# Patient Record
Sex: Male | Born: 1940 | Race: White | Hispanic: No | Marital: Married | Smoking: Never smoker
Health system: Southern US, Community
[De-identification: ages and names within clinical notes are randomized; demographics above are authoritative.]

## PROBLEM LIST (undated history)

## (undated) DIAGNOSIS — I10 Essential (primary) hypertension: Secondary | ICD-10-CM

## (undated) DIAGNOSIS — E119 Type 2 diabetes mellitus without complications: Secondary | ICD-10-CM

## (undated) DIAGNOSIS — I4891 Unspecified atrial fibrillation: Secondary | ICD-10-CM

## (undated) HISTORY — PX: KNEE SURGERY: SHX244

---

## 2020-01-28 ENCOUNTER — Ambulatory Visit: Payer: Self-pay | Admitting: Orthopaedic Surgery

## 2020-04-18 ENCOUNTER — Other Ambulatory Visit: Payer: Self-pay

## 2020-04-18 ENCOUNTER — Emergency Department (HOSPITAL_COMMUNITY)
Admission: EM | Admit: 2020-04-18 | Discharge: 2020-04-18 | Disposition: A | Discharge status: Home / Self Care | Payer: Medicare HMO | Attending: Emergency Medicine | Admitting: Emergency Medicine

## 2020-04-18 ENCOUNTER — Encounter (HOSPITAL_COMMUNITY): Payer: Self-pay | Admitting: Emergency Medicine

## 2020-04-18 DIAGNOSIS — Z5321 Procedure and treatment not carried out due to patient leaving prior to being seen by health care provider: Secondary | ICD-10-CM | POA: Insufficient documentation

## 2020-04-18 DIAGNOSIS — R531 Weakness: Secondary | ICD-10-CM | POA: Insufficient documentation

## 2020-04-18 LAB — URINALYSIS, ROUTINE W REFLEX MICROSCOPIC
Bilirubin Urine: NEGATIVE
Glucose, UA: NEGATIVE mg/dL
Hgb urine dipstick: NEGATIVE
Ketones, ur: NEGATIVE mg/dL
Nitrite: NEGATIVE
Protein, ur: NEGATIVE mg/dL
Specific Gravity, Urine: 1.019 (ref 1.005–1.030)
pH: 5 (ref 5.0–8.0)

## 2020-04-18 LAB — BASIC METABOLIC PANEL
Anion gap: 12 (ref 5–15)
BUN: 16 mg/dL (ref 8–23)
CO2: 25 mmol/L (ref 22–32)
Calcium: 9.1 mg/dL (ref 8.9–10.3)
Chloride: 100 mmol/L (ref 98–111)
Creatinine, Ser: 1.06 mg/dL (ref 0.61–1.24)
GFR calc Af Amer: >60 mL/min (ref >60)
GFR calc non Af Amer: >60 mL/min (ref >60)
Glucose, Bld: 121 mg/dL — ABNORMAL HIGH (ref 70–99)
Potassium: 5 mmol/L (ref 3.5–5.1)
Sodium: 137 mmol/L (ref 135–145)

## 2020-04-18 LAB — CBC
HCT: 44.1 % (ref 39.0–52.0)
Hemoglobin: 13.8 g/dL (ref 13.0–17.0)
MCH: 30.3 pg (ref 26.0–34.0)
MCHC: 31.3 g/dL (ref 30.0–36.0)
MCV: 96.7 fL (ref 80.0–100.0)
Platelets: 172 10*3/uL (ref 150–400)
RBC: 4.56 MIL/uL (ref 4.22–5.81)
RDW: 15 % (ref 11.5–15.5)
WBC: 7.8 10*3/uL (ref 4.0–10.5)
nRBC: 0 % (ref 0.0–0.2)

## 2020-04-18 MED ORDER — SODIUM CHLORIDE 0.9% FLUSH: 3.0000 mL | Freq: Once | INTRAVENOUS | Status: DC

## 2020-04-18 NOTE — ED Notes (Signed)
Pt and family member were upset about wait time and stated that they were not going to continue to wait. This NT encouraged the pt to stay and be seen by one of our ED providers but pt proceeded to leave.

## 2020-04-18 NOTE — ED Triage Notes (Signed)
Patient arrives to ED with EMS with complaints of worsening of his generalized weakness for the last couple of days. Patient states his blood pressure at home has been below 100 systolic and hes having a harder time walking.

## 2020-04-21 ENCOUNTER — Other Ambulatory Visit: Payer: Self-pay

## 2020-04-21 ENCOUNTER — Emergency Department (HOSPITAL_BASED_OUTPATIENT_CLINIC_OR_DEPARTMENT_OTHER)
Admission: EM | Admit: 2020-04-21 | Discharge: 2020-04-21 | Disposition: A | Discharge status: Home / Self Care | Payer: Medicare HMO | Attending: Emergency Medicine | Admitting: Emergency Medicine

## 2020-04-21 ENCOUNTER — Emergency Department (HOSPITAL_BASED_OUTPATIENT_CLINIC_OR_DEPARTMENT_OTHER): Payer: Medicare HMO

## 2020-04-21 ENCOUNTER — Encounter (HOSPITAL_BASED_OUTPATIENT_CLINIC_OR_DEPARTMENT_OTHER): Payer: Self-pay | Admitting: Emergency Medicine

## 2020-04-21 DIAGNOSIS — E119 Type 2 diabetes mellitus without complications: Secondary | ICD-10-CM | POA: Diagnosis not present

## 2020-04-21 DIAGNOSIS — Z79899 Other long term (current) drug therapy: Secondary | ICD-10-CM | POA: Diagnosis not present

## 2020-04-21 DIAGNOSIS — R531 Weakness: Secondary | ICD-10-CM | POA: Diagnosis present

## 2020-04-21 DIAGNOSIS — I10 Essential (primary) hypertension: Secondary | ICD-10-CM | POA: Insufficient documentation

## 2020-04-21 DIAGNOSIS — Z794 Long term (current) use of insulin: Secondary | ICD-10-CM | POA: Diagnosis not present

## 2020-04-21 DIAGNOSIS — Z7901 Long term (current) use of anticoagulants: Secondary | ICD-10-CM | POA: Insufficient documentation

## 2020-04-21 DIAGNOSIS — I4891 Unspecified atrial fibrillation: Secondary | ICD-10-CM | POA: Diagnosis not present

## 2020-04-21 DIAGNOSIS — M6281 Muscle weakness (generalized): Secondary | ICD-10-CM | POA: Diagnosis not present

## 2020-04-21 HISTORY — DX: Type 2 diabetes mellitus without complications: E11.9

## 2020-04-21 HISTORY — DX: Unspecified atrial fibrillation: I48.91

## 2020-04-21 HISTORY — DX: Essential (primary) hypertension: I10

## 2020-04-21 LAB — BASIC METABOLIC PANEL
Anion gap: 10 (ref 5–15)
BUN: 13 mg/dL (ref 8–23)
CO2: 22 mmol/L (ref 22–32)
Calcium: 8.6 mg/dL — ABNORMAL LOW (ref 8.9–10.3)
Chloride: 106 mmol/L (ref 98–111)
Creatinine, Ser: 0.97 mg/dL (ref 0.61–1.24)
GFR calc Af Amer: >60 mL/min (ref >60)
GFR calc non Af Amer: >60 mL/min (ref >60)
Glucose, Bld: 176 mg/dL — ABNORMAL HIGH (ref 70–99)
Potassium: 4 mmol/L (ref 3.5–5.1)
Sodium: 138 mmol/L (ref 135–145)

## 2020-04-21 LAB — CBC
HCT: 40.9 % (ref 39.0–52.0)
Hemoglobin: 13.2 g/dL (ref 13.0–17.0)
MCH: 30.6 pg (ref 26.0–34.0)
MCHC: 32.3 g/dL (ref 30.0–36.0)
MCV: 94.9 fL (ref 80.0–100.0)
Platelets: 153 10*3/uL (ref 150–400)
RBC: 4.31 MIL/uL (ref 4.22–5.81)
RDW: 14.9 % (ref 11.5–15.5)
WBC: 7.4 10*3/uL (ref 4.0–10.5)
nRBC: 0 % (ref 0.0–0.2)

## 2020-04-21 LAB — URINALYSIS, ROUTINE W REFLEX MICROSCOPIC
Bilirubin Urine: NEGATIVE
Glucose, UA: NEGATIVE mg/dL
Hgb urine dipstick: NEGATIVE
Ketones, ur: NEGATIVE mg/dL
Leukocytes,Ua: NEGATIVE
Nitrite: NEGATIVE
Protein, ur: NEGATIVE mg/dL
Specific Gravity, Urine: 1.01 (ref 1.005–1.030)
pH: 6 (ref 5.0–8.0)

## 2020-04-21 LAB — CBG MONITORING, ED: Glucose-Capillary: 153 mg/dL — ABNORMAL HIGH (ref 70–99)

## 2020-04-21 LAB — PROTIME-INR
INR: 2.1 — ABNORMAL HIGH (ref 0.8–1.2)
Prothrombin Time: 22.7 seconds — ABNORMAL HIGH (ref 11.4–15.2)

## 2020-04-21 LAB — D-DIMER, QUANTITATIVE: D-Dimer, Quant: 1.22 ug/mL-FEU — ABNORMAL HIGH (ref 0.00–0.50)

## 2020-04-21 LAB — TROPONIN I (HIGH SENSITIVITY): Troponin I (High Sensitivity): 8 ng/L (ref <18)

## 2020-04-21 MED ORDER — IOHEXOL 350 MG/ML SOLN
100.0000 mL | Freq: Once | INTRAVENOUS | Status: AC | PRN
  Administered 2020-04-21: 100 mL via INTRAVENOUS

## 2020-04-21 MED ORDER — SODIUM CHLORIDE 0.9 % IV BOLUS
1000.0000 mL | Freq: Once | INTRAVENOUS | Status: AC
  Administered 2020-04-21: 1000 mL via INTRAVENOUS

## 2020-04-21 NOTE — ED Notes (Signed)
Pt unable to void at this time. Aware to make inform staff when ready to give specimen .

## 2020-04-21 NOTE — ED Notes (Signed)
Pt. Ambulated in room with walker, was able to walk and move around room, no dizziness, no sob, took a few steps outside the room in hallway and "felt like a new man".

## 2020-04-21 NOTE — ED Triage Notes (Signed)
Brought in c/o generalized weakness. Wife states she took his BP this morning and systolic was in the 70's. He has had multiple issues since a R knee replacement in April and is currently having PT at home.

## 2020-04-21 NOTE — ED Notes (Signed)
Patient transported to X-ray 

## 2020-04-21 NOTE — ED Provider Notes (Signed)
[facility] EMERGENCY DEPARTMENT Provider Note   CSN: [id number] Arrival date & time: 04/21/20  4782     History Chief Complaint  Patient presents with  . Weakness    Bobby Petty is a 79 y.o. male.  HPI 79 year old male presents with weakness. History is primarily driven by his wife. Patient has had prolonged course with knee replacement at the end of April with a several week stay in the [facility] hospital for weakness and orthostatic hypotension. Was placed on midodrine at that time. Went to a SNF and then got out a few days ago. Has a lot of difficulty getting up and walking. Seems to get hypotensive when he stands up. Went to an outside hospital 2 days ago and was taken off of metoprolol. Patient states he feels fine right now but has difficulty walking. The weakness feels like it is all over. No troubles urinating though he did have a Foley catheter for about 40 days. No fevers, cough, chest pain. He has felt short of breath over the last week or so when getting up to walk.   Past Medical History:  Diagnosis Date  . Atrial fibrillation (HCC)   . Diabetes mellitus without complication (HCC)   . Hypertension     There are no problems to display for this patient.   Past Surgical History:  Procedure Laterality Date  . KNEE SURGERY         No family history on file.  Social History   Tobacco Use  . Smoking status: Never Smoker  . Smokeless tobacco: Never Used  Substance Use Topics  . Alcohol use: Not on file  . Drug use: Not on file    Home Medications Prior to Admission medications   Medication Sig Start Date End Date Taking? Authorizing Provider  atorvastatin (LIPITOR) 20 MG tablet Take 20 mg by mouth daily.   Yes [provider]  flecainide (TAMBOCOR) 50 MG tablet Take 50 mg by mouth 2 (two) times daily.   Yes [provider]  glipiZIDE (GLUCOTROL) 10 MG tablet Take 10 mg by mouth daily before breakfast.   Yes [provider]  insulin lispro protamine-lispro (HUMALOG 50/50 MIX) (50-50) 100 UNIT/ML SUSP injection Inject into the skin 2 (two) times daily before a meal.   Yes [provider]  metoprolol tartrate (LOPRESSOR) 25 MG tablet Take 25 mg by mouth 2 (two) times daily.   Yes [provider]  midodrine (PROAMATINE) 5 MG tablet Take 5 mg by mouth 3 (three) times daily with meals.   Yes [provider]  tamsulosin (FLOMAX) 0.4 MG CAPS capsule Take 0.4 mg by mouth.   Yes [provider]  warfarin (COUMADIN) 7.5 MG tablet Take 7.5 mg by mouth daily.   Yes [provider]    Allergies    Gabapentin  Review of Systems   Review of Systems  Constitutional: Positive for fatigue. Negative for fever.  Respiratory: Positive for shortness of breath. Negative for cough.   Cardiovascular: Negative for chest pain and leg swelling.  Gastrointestinal: Negative for abdominal pain, diarrhea and vomiting.  Genitourinary: Negative for difficulty urinating and dysuria.  Neurological: Positive for weakness. Negative for headaches.  All other systems reviewed and are negative.   Physical Exam Updated Vital Signs BP 138/79   Pulse 85   Temp 97.9 F (36.6 C) (Oral)   Resp 19   Ht 5\' 11"  (1.803 m)   Wt 63.5 kg   SpO2 99%  BMI 19.52 kg/m   Physical Exam Vitals and nursing note reviewed.  Constitutional:      Appearance: He is well-developed.  HENT:     Head: Normocephalic and atraumatic.     Right Ear: External ear normal.     Left Ear: External ear normal.     Nose: Nose normal.  Eyes:     General:        Right eye: No discharge.        Left eye: No discharge.     Extraocular Movements: Extraocular movements intact.     Pupils: Pupils are equal, round, and reactive to light.  Cardiovascular:     Rate and Rhythm: Normal rate and regular rhythm.     Heart sounds: Normal heart sounds.  Pulmonary:     Effort: Pulmonary effort is normal.     Breath  sounds: Normal breath sounds.  Abdominal:     General: There is no distension.     Palpations: Abdomen is soft.     Tenderness: There is no abdominal tenderness.  Musculoskeletal:     Cervical back: Neck supple.  Skin:    General: Skin is warm and dry.  Neurological:     Mental Status: He is alert.     Comments: CN 3-12 grossly intact. 5/5 strength in all 4 extremities. Grossly normal sensation. Normal finger to nose.   Psychiatric:        Mood and Affect: Mood is not anxious.     ED Results / Procedures / Treatments   Labs (all labs ordered are listed, but only abnormal results are displayed) Labs Reviewed  BASIC METABOLIC PANEL - Abnormal; Notable for the following components:      Result Value   Glucose, Bld 176 (*)    Calcium 8.6 (*)    All other components within normal limits  PROTIME-INR - Abnormal; Notable for the following components:   Prothrombin Time 22.7 (*)    INR 2.1 (*)    All other components within normal limits  D-DIMER, QUANTITATIVE (NOT AT [facility]) - Abnormal; Notable for the following components:   D-Dimer, Quant 1.22 (*)    All other components within normal limits  CBG MONITORING, ED - Abnormal; Notable for the following components:   Glucose-Capillary 153 (*)    All other components within normal limits  CBC  URINALYSIS, ROUTINE W REFLEX MICROSCOPIC  TROPONIN I (HIGH SENSITIVITY)    EKG EKG Interpretation  Date/Time:  Thursday April 21 2020 09:48:25 EDT Ventricular Rate:  87 PR Interval:    QRS Duration: 97 QT Interval:  363 QTC Calculation: 437 R Axis:   -24 Text Interpretation: Sinus rhythm Consider left atrial enlargement Borderline left axis deviation Low voltage, extremity leads no significant change since April 18 2020 Confirmed by Sherwood Gambler [phone]) on 04/21/2020 10:03:24 AM   Radiology DG Chest 2 View  Result Date: 04/21/2020 CLINICAL DATA:  Shortness of breath. EXAM: CHEST - 2 VIEW COMPARISON:  Single-view of the chest  03/16/2020. PA and lateral chest 02/24/2020. FINDINGS: Lungs clear. Heart size normal. Atherosclerosis noted. No pneumothorax or pleural fluid. No acute or focal bony abnormality. IMPRESSION: No acute disease. Aortic Atherosclerosis (ICD10-I70.0). Electronically Signed   By: Inge Rise M.D.   On: 04/21/2020 11:27   CT Angio Chest PE W and/or Wo Contrast  Result Date: 04/21/2020 CLINICAL DATA:  Shortness of breath, PE suspected EXAM: CT ANGIOGRAPHY CHEST WITH CONTRAST TECHNIQUE: Multidetector CT imaging of the chest was performed using the standard  protocol during bolus administration of intravenous contrast. Multiplanar CT image reconstructions and MIPs were obtained to evaluate the vascular anatomy. CONTRAST:  OMNIPAQUE IOHEXOL 350 MG/ML SOLN COMPARISON:  CT chest abdomen pelvis, 08/10/2018 FINDINGS: Cardiovascular: Satisfactory opacification of the pulmonary arteries to the segmental level. No evidence of pulmonary embolism. Normal heart size. Three-vessel coronary artery calcifications. No pericardial effusion. Aortic atherosclerosis. Mediastinum/Nodes: No enlarged mediastinal, hilar, or axillary lymph nodes. Thyroid gland, trachea, and esophagus demonstrate no significant findings. Lungs/Pleura: Dependent bibasilar scarring and/or partial atelectasis. Incidental stable, small, benign 3 mm nodule of the right upper lobe (series 5, image 51). No pleural effusion or pneumothorax. Upper Abdomen: No acute abnormality. Musculoskeletal: No chest wall abnormality. No acute or significant osseous findings. Review of the MIP images confirms the above findings. IMPRESSION: 1. Negative examination for pulmonary embolism. 2. Dependent bibasilar scarring and/or partial atelectasis. 3. Coronary artery disease.  Aortic Atherosclerosis (ICD10-I70.0). Electronically Signed   By: Lauralyn Primes M.D.   On: 04/21/2020 12:33    Procedures Procedures (including critical care time)  Medications Ordered in  ED Medications  sodium chloride 0.9 % bolus 1,000 mL (0 mLs Intravenous Stopped 04/21/20 1300)  iohexol (OMNIPAQUE) 350 MG/ML injection 100 mL (100 mLs Intravenous Contrast Given 04/21/20 1152)    ED Course  I have reviewed the triage vital signs and the nursing notes.  Pertinent labs & imaging results that were available during my care of the patient were reviewed by me and considered in my medical decision making (see chart for details).    MDM Rules/Calculators/A&P                          Patient and wife are describing weakness that has been ongoing for quite some time.  He is not hypotensive here.  Initially they did not test his orthostatics with standing because of the weakness he is been having at home but when encouraged to walk with a walker, he was able to ambulate with no difficulty or dizziness.  He does seem to be having low blood pressures when he stands at home.  He is on midodrine and was taken off the metoprolol.  He was worked up for the shortness of breath which is unrevealing with negative troponin, no acute ischemic changes on ECG, and negative CTA.  I have reviewed these labs and images and ECG.  At this point, I think he needs further follow-up and outpatient care and likely further rehab by his PCP but there is no emergent reason he needs admission.  Infection seems unlikely.  Discharged home with return precautions. Final Clinical Impression(s) / ED Diagnoses Final diagnoses:  Generalized weakness    Rx / DC Orders ED Discharge Orders    None       Pricilla Loveless, MD 04/21/20 1433

## 2020-05-27 ENCOUNTER — Telehealth: Payer: Self-pay | Admitting: Internal Medicine

## 2020-05-27 NOTE — Telephone Encounter (Signed)
Spoke with patient's wife, Talbert Forest, regarding Palliative services and all questions were answered and she was in agreement with this.  I have scheduled an In-person Consult for 06/09/20 @ 8:30 AM.

## 2020-06-08 ENCOUNTER — Telehealth: Payer: Self-pay | Admitting: Internal Medicine

## 2020-06-08 NOTE — Telephone Encounter (Signed)
Rec'd call from wife, Talbert Forest, and she is needing to cancel the Palliative Consult scheduled for 7/29 @ 8:30 AM.  I told her that I would call her back after I look at the NP's schedule to see when I could reschedule the appointment and that I would call her back, she was in agreement with this.  She stated that the patient is doing much better than he was before.

## 2020-06-08 NOTE — Telephone Encounter (Signed)
Spoke with wife and have rescheduled the Initial Palliative Consult to 06/29/20 @ 8:30 AM.  I informed wife if anything changed in patient's condition and he needed to be seen sooner to call us and we would get someone out to see him sooner.  She stated that he is still receiving home health services and has a Charity fundraiser and PT coming out to see him.  She agreed to call us if patient needed to be seen sooner.

## 2020-06-09 ENCOUNTER — Other Ambulatory Visit: Payer: Medicare HMO | Admitting: Internal Medicine

## 2020-06-29 ENCOUNTER — Other Ambulatory Visit: Payer: Self-pay

## 2020-06-29 ENCOUNTER — Other Ambulatory Visit: Payer: Medicare HMO | Admitting: Internal Medicine

## 2020-07-06 ENCOUNTER — Telehealth: Payer: Self-pay | Admitting: Internal Medicine

## 2020-07-06 NOTE — Telephone Encounter (Signed)
Rec'd voicemail from patient's wife, Talbert Forest, requesting a return call.  Spoke with wife and she would like to cancel Palliative services due to the patient has done a complete turn around and has improved greatly and she doesn't feel that he needs our services at this time.  Told her I would cancel Palliative services and notify MD office and she was in agreement with this.

## 2020-07-11 ENCOUNTER — Other Ambulatory Visit: Payer: Medicare HMO | Admitting: Internal Medicine

## 2020-10-15 ENCOUNTER — Other Ambulatory Visit: Payer: Self-pay

## 2020-10-15 ENCOUNTER — Observation Stay (HOSPITAL_COMMUNITY)
Admission: EM | Admit: 2020-10-15 | Discharge: 2020-10-18 | Disposition: A | Discharge status: Home / Self Care | Payer: Medicare HMO | Attending: Internal Medicine | Admitting: Internal Medicine

## 2020-10-15 ENCOUNTER — Encounter (HOSPITAL_COMMUNITY): Payer: Self-pay | Admitting: Emergency Medicine

## 2020-10-15 DIAGNOSIS — Z794 Long term (current) use of insulin: Secondary | ICD-10-CM | POA: Insufficient documentation

## 2020-10-15 DIAGNOSIS — R253 Fasciculation: Secondary | ICD-10-CM | POA: Diagnosis present

## 2020-10-15 DIAGNOSIS — G253 Myoclonus: Secondary | ICD-10-CM | POA: Diagnosis not present

## 2020-10-15 DIAGNOSIS — Z79899 Other long term (current) drug therapy: Secondary | ICD-10-CM | POA: Insufficient documentation

## 2020-10-15 DIAGNOSIS — I4891 Unspecified atrial fibrillation: Secondary | ICD-10-CM | POA: Insufficient documentation

## 2020-10-15 DIAGNOSIS — I10 Essential (primary) hypertension: Secondary | ICD-10-CM | POA: Insufficient documentation

## 2020-10-15 DIAGNOSIS — E119 Type 2 diabetes mellitus without complications: Secondary | ICD-10-CM | POA: Diagnosis not present

## 2020-10-15 DIAGNOSIS — Z20822 Contact with and (suspected) exposure to covid-19: Secondary | ICD-10-CM | POA: Insufficient documentation

## 2020-10-15 DIAGNOSIS — I480 Paroxysmal atrial fibrillation: Secondary | ICD-10-CM

## 2020-10-15 LAB — CBC WITH DIFFERENTIAL/PLATELET
Abs Immature Granulocytes: 0.02 10*3/uL (ref 0.00–0.07)
Basophils Absolute: 0.1 10*3/uL (ref 0.0–0.1)
Basophils Relative: 1 %
Eosinophils Absolute: 0.1 10*3/uL (ref 0.0–0.5)
Eosinophils Relative: 2 %
HCT: 44.9 % (ref 39.0–52.0)
Hemoglobin: 14.7 g/dL (ref 13.0–17.0)
Immature Granulocytes: 0 %
Lymphocytes Relative: 25 %
Lymphs Abs: 1.7 10*3/uL (ref 0.7–4.0)
MCH: 31.6 pg (ref 26.0–34.0)
MCHC: 32.7 g/dL (ref 30.0–36.0)
MCV: 96.6 fL (ref 80.0–100.0)
Monocytes Absolute: 0.5 10*3/uL (ref 0.1–1.0)
Monocytes Relative: 8 %
Neutro Abs: 4.3 10*3/uL (ref 1.7–7.7)
Neutrophils Relative %: 64 %
Platelets: 150 10*3/uL (ref 150–400)
RBC: 4.65 MIL/uL (ref 4.22–5.81)
RDW: 13.3 % (ref 11.5–15.5)
WBC: 6.7 10*3/uL (ref 4.0–10.5)
nRBC: 0 % (ref 0.0–0.2)

## 2020-10-15 LAB — COMPREHENSIVE METABOLIC PANEL
ALT: 52 U/L — ABNORMAL HIGH (ref 0–44)
AST: 47 U/L — ABNORMAL HIGH (ref 15–41)
Albumin: 3.8 g/dL (ref 3.5–5.0)
Alkaline Phosphatase: 64 U/L (ref 38–126)
Anion gap: 13 (ref 5–15)
BUN: 15 mg/dL (ref 8–23)
CO2: 21 mmol/L — ABNORMAL LOW (ref 22–32)
Calcium: 8.7 mg/dL — ABNORMAL LOW (ref 8.9–10.3)
Chloride: 103 mmol/L (ref 98–111)
Creatinine, Ser: 0.83 mg/dL (ref 0.61–1.24)
GFR, Estimated: >60 mL/min (ref >60)
Glucose, Bld: 143 mg/dL — ABNORMAL HIGH (ref 70–99)
Potassium: 4.2 mmol/L (ref 3.5–5.1)
Sodium: 137 mmol/L (ref 135–145)
Total Bilirubin: 0.8 mg/dL (ref 0.3–1.2)
Total Protein: 6.8 g/dL (ref 6.5–8.1)

## 2020-10-15 NOTE — ED Triage Notes (Signed)
Patient arrived with EMS from home reports worsening generalized twitchings/tremors onset this morning , respirations unlabored / denies pain , alert and oriented.

## 2020-10-16 ENCOUNTER — Emergency Department (HOSPITAL_COMMUNITY): Payer: Medicare HMO

## 2020-10-16 ENCOUNTER — Encounter (HOSPITAL_COMMUNITY): Payer: Self-pay | Admitting: Radiology

## 2020-10-16 DIAGNOSIS — G253 Myoclonus: Secondary | ICD-10-CM | POA: Diagnosis present

## 2020-10-16 DIAGNOSIS — R251 Tremor, unspecified: Secondary | ICD-10-CM | POA: Diagnosis not present

## 2020-10-16 LAB — CBC WITH DIFFERENTIAL/PLATELET
Abs Immature Granulocytes: 0.03 10*3/uL (ref 0.00–0.07)
Basophils Absolute: 0 10*3/uL (ref 0.0–0.1)
Basophils Relative: 1 %
Eosinophils Absolute: 0 10*3/uL (ref 0.0–0.5)
Eosinophils Relative: 1 %
HCT: 42.7 % (ref 39.0–52.0)
Hemoglobin: 13.8 g/dL (ref 13.0–17.0)
Immature Granulocytes: 1 %
Lymphocytes Relative: 17 %
Lymphs Abs: 1 10*3/uL (ref 0.7–4.0)
MCH: 31.7 pg (ref 26.0–34.0)
MCHC: 32.3 g/dL (ref 30.0–36.0)
MCV: 98.2 fL (ref 80.0–100.0)
Monocytes Absolute: 0.6 10*3/uL (ref 0.1–1.0)
Monocytes Relative: 9 %
Neutro Abs: 4.4 10*3/uL (ref 1.7–7.7)
Neutrophils Relative %: 71 %
Platelets: 138 10*3/uL — ABNORMAL LOW (ref 150–400)
RBC: 4.35 MIL/uL (ref 4.22–5.81)
RDW: 13.6 % (ref 11.5–15.5)
WBC: 6.1 10*3/uL (ref 4.0–10.5)
nRBC: 0 % (ref 0.0–0.2)

## 2020-10-16 LAB — I-STAT CHEM 8, ED
BUN: 21 mg/dL (ref 8–23)
Calcium, Ion: 1.09 mmol/L — ABNORMAL LOW (ref 1.15–1.40)
Chloride: 104 mmol/L (ref 98–111)
Creatinine, Ser: 0.9 mg/dL (ref 0.61–1.24)
Glucose, Bld: 140 mg/dL — ABNORMAL HIGH (ref 70–99)
HCT: 44 % (ref 39.0–52.0)
Hemoglobin: 15 g/dL (ref 13.0–17.0)
Potassium: 4.3 mmol/L (ref 3.5–5.1)
Sodium: 139 mmol/L (ref 135–145)
TCO2: 23 mmol/L (ref 22–32)

## 2020-10-16 LAB — COMPREHENSIVE METABOLIC PANEL
ALT: 48 U/L — ABNORMAL HIGH (ref 0–44)
AST: 35 U/L (ref 15–41)
Albumin: 3.6 g/dL (ref 3.5–5.0)
Alkaline Phosphatase: 53 U/L (ref 38–126)
Anion gap: 12 (ref 5–15)
BUN: 19 mg/dL (ref 8–23)
CO2: 21 mmol/L — ABNORMAL LOW (ref 22–32)
Calcium: 8.4 mg/dL — ABNORMAL LOW (ref 8.9–10.3)
Chloride: 102 mmol/L (ref 98–111)
Creatinine, Ser: 0.87 mg/dL (ref 0.61–1.24)
GFR, Estimated: >60 mL/min (ref >60)
Glucose, Bld: 122 mg/dL — ABNORMAL HIGH (ref 70–99)
Potassium: 4 mmol/L (ref 3.5–5.1)
Sodium: 135 mmol/L (ref 135–145)
Total Bilirubin: 0.9 mg/dL (ref 0.3–1.2)
Total Protein: 6.4 g/dL — ABNORMAL LOW (ref 6.5–8.1)

## 2020-10-16 LAB — MAGNESIUM
Magnesium: 2 mg/dL (ref 1.7–2.4)
Magnesium: 2.1 mg/dL (ref 1.7–2.4)

## 2020-10-16 LAB — PHOSPHORUS: Phosphorus: 2.7 mg/dL (ref 2.5–4.6)

## 2020-10-16 LAB — PROTIME-INR
INR: 2.4 — ABNORMAL HIGH (ref 0.8–1.2)
INR: 2.4 — ABNORMAL HIGH (ref 0.8–1.2)
Prothrombin Time: 25 seconds — ABNORMAL HIGH (ref 11.4–15.2)
Prothrombin Time: 25.3 seconds — ABNORMAL HIGH (ref 11.4–15.2)

## 2020-10-16 LAB — CBG MONITORING, ED: Glucose-Capillary: 148 mg/dL — ABNORMAL HIGH (ref 70–99)

## 2020-10-16 LAB — RESP PANEL BY RT-PCR (FLU A&B, COVID) ARPGX2
Influenza A by PCR: NEGATIVE
Influenza B by PCR: NEGATIVE
SARS Coronavirus 2 by RT PCR: NEGATIVE

## 2020-10-16 LAB — TSH: TSH: 1.04 u[IU]/mL (ref 0.350–4.500)

## 2020-10-16 LAB — AMMONIA: Ammonia: 21 umol/L (ref 9–35)

## 2020-10-16 LAB — HEMOGLOBIN A1C
Hgb A1c MFr Bld: 5.7 % — ABNORMAL HIGH (ref 4.8–5.6)
Mean Plasma Glucose: 116.89 mg/dL

## 2020-10-16 MED ORDER — DIAZEPAM 5 MG/ML IJ SOLN
5.0000 mg | Freq: Once | INTRAMUSCULAR | Status: AC
  Administered 2020-10-16: 5 mg via INTRAVENOUS
  Filled 2020-10-16: qty 2

## 2020-10-16 MED ORDER — DIAZEPAM 5 MG/ML IJ SOLN
2.5000 mg | Freq: Once | INTRAMUSCULAR | Status: AC
  Administered 2020-10-16: 2.5 mg via INTRAVENOUS
  Filled 2020-10-16: qty 2

## 2020-10-16 MED ORDER — ACETAMINOPHEN 650 MG RE SUPP: 650.0000 mg | Freq: Four times a day (QID) | RECTAL | Status: DC | PRN

## 2020-10-16 MED ORDER — INSULIN ASPART 100 UNIT/ML ~~LOC~~ SOLN
0.0000 [IU] | Freq: Three times a day (TID) | SUBCUTANEOUS | Status: DC
  Administered 2020-10-17: 2 [IU] via SUBCUTANEOUS
  Administered 2020-10-17: 3 [IU] via SUBCUTANEOUS
  Administered 2020-10-17 – 2020-10-18 (×3): 2 [IU] via SUBCUTANEOUS

## 2020-10-16 MED ORDER — WARFARIN SODIUM 5 MG PO TABS
5.0000 mg | ORAL_TABLET | Freq: Once | ORAL | Status: AC
  Administered 2020-10-16: 5 mg via ORAL
  Filled 2020-10-16 (×2): qty 1

## 2020-10-16 MED ORDER — ATENOLOL 25 MG PO TABS
12.5000 mg | ORAL_TABLET | Freq: Every day | ORAL | Status: DC
  Administered 2020-10-17 – 2020-10-18 (×2): 12.5 mg via ORAL
  Filled 2020-10-16 (×2): qty 1

## 2020-10-16 MED ORDER — ALPRAZOLAM 0.5 MG PO TABS: 0.5000 mg | ORAL_TABLET | Freq: Every evening | ORAL | Status: DC | PRN

## 2020-10-16 MED ORDER — ATORVASTATIN CALCIUM 10 MG PO TABS
20.0000 mg | ORAL_TABLET | Freq: Every day | ORAL | Status: DC
  Administered 2020-10-16 – 2020-10-18 (×3): 20 mg via ORAL
  Filled 2020-10-16 (×3): qty 2

## 2020-10-16 MED ORDER — ACETAMINOPHEN 325 MG PO TABS: 650.0000 mg | ORAL_TABLET | Freq: Four times a day (QID) | ORAL | Status: DC | PRN

## 2020-10-16 MED ORDER — WARFARIN - PHARMACIST DOSING INPATIENT: Freq: Every day | Status: DC

## 2020-10-16 NOTE — ED Notes (Signed)
Pt transported to MRI 

## 2020-10-16 NOTE — ED Notes (Signed)
Attempted to call report on patient. RN unable to take report at this time.  

## 2020-10-16 NOTE — Procedures (Signed)
Patient Name: Bobby Petty  MRN: [id number]  Epilepsy Attending: Charlsie Quest  Referring Physician/Provider: Dr Amadeo Garnet Cardama Date: 10/16/2020 Duration:   Patient history: 79yoM with presents to the emergency department with 24 hours of full body jerking.  Level of alertness: Awake, drowsy, sleep, comatose, lethargic  AEDs during EEG study: None  Technical aspects: This EEG study was done with scalp electrodes positioned according to the 10-20 International system of electrode placement. Electrical activity was acquired at a sampling rate of 500Hz  and reviewed with a high frequency filter of 70Hz  and a low frequency filter of 1Hz . EEG data were recorded continuously and digitally stored.   Description: The posterior dominant rhythm consists of 8 Hz activity of moderate voltage (25-35 uV) seen predominantly in posterior head regions, symmetric and reactive to eye opening and eye closing. Physiologic photic driving was not seen during photic stimulation.  Hyperventilation was not performed.     Multiple episodes of intermittent body twitching were noted. Concomitant eeg before, during and after the event didn't show any eeg change to suggest seizure.  IMPRESSION: This study is within normal limits. No seizures or epileptiform discharges were seen throughout the recording.  Multiple episodes of intermittent body twitching were noted without concomitant eeg change and were NOT epileptic.   Rory Montel 

## 2020-10-16 NOTE — Progress Notes (Signed)
ANTICOAGULATION CONSULT NOTE   Pharmacy Consult for Warfarin  Indication: atrial fibrillation  Allergies  Allergen Reactions  . Gabapentin     Patient Measurements: Height: 6' (182.9 cm) Weight: 85 kg (187 lb 6.3 oz) IBW/kg (Calculated) : 77.6 Heparin Dosing Weight:   Vital Signs: Temp: 97.7 F (36.5 C) (12/05 0200) Temp Source: Oral (12/05 0200) BP: 138/81 (12/05 1100) Pulse Rate: 77 (12/05 1100)  Labs: Recent Labs    10/15/20 2231 10/15/20 2231 10/16/20 0416 10/16/20 0525 10/16/20 1008  HGB 14.7   < >  --  15.0 13.8  HCT 44.9  --   --  44.0 42.7  PLT 150  --   --   --  138*  LABPROT  --   --  25.0*  --  25.3*  INR  --   --  2.4*  --  2.4*  CREATININE 0.83  --   --  0.90 0.87   < > = values in this interval not displayed.    Estimated Creatinine Clearance: 75.6 mL/min (by C-G formula based on SCr of 0.87 mg/dL).   Medical History: Past Medical History:  Diagnosis Date  . Atrial fibrillation (HCC)   . Diabetes mellitus without complication (HCC)   . Hypertension     Medications:  Scheduled:  . atorvastatin  20 mg Oral Daily  . warfarin  5 mg Oral ONCE-1600  . Warfarin - Pharmacist Dosing Inpatient   Does not apply q1600    Assessment: Patient is a 32 yom that is being admitted for twitching. At home the patient takes warfarin for Afib. Pharmacy has been asked to continue dosing his warfarin while inpatient.  Marland Kitchen PTA regimen: Warfarin 7.5mg  MWFSa and 5 mg TTSun  Goal of Therapy:  INR 2-3 Monitor platelets by anticoagulation protocol: Yes   Plan:  - Patient's INR is currently therapeutic at 2.4 - Will continue home regimen with Warfarin 5mg  PO x 1 dose tonight  - Monitor patient for s/s of bleeding and PT-inr daily  PharmD. BCPS  10/16/2020,11:24 AM

## 2020-10-16 NOTE — Hospital Course (Addendum)
Admitted 10/15/2020  Allergies: Gabapentin Pertinent Hx: Afib, DM, HTN, CAD, recent knee replacement (03/2020), BPH?, Hypotension (midodrine), unintentional weight loss  79 y.o. 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 EEG negative. MRI negative. Diazepam given in ED worked, no mas shaky shaky   *AFIB - warfarin and flecainide in sinus  Consults: none  Meds: Warfarin, SSI, atorvastatin, atenolol VTE ppx: Warfarin IVF: none Diet: HH    Home meds: atorvastatin, flecainide, glipizide 10 mg qAM, semiglutide 0.25 mg qweek, lispro BID, metoprolol, midodrine 5 mg TID, tamsulosin, warfarin, alprazolam 0.6 mg qHS  Normo-HTN, without tachycardia on RA No leukocytosis Mild elevated AST/ALT INR 2.4 Flecanide level pending Ammonia pending  Past elevated Ddimer  Past Medical History:  Diagnosis Date   Atrial fibrillation ([place])    Diabetes mellitus without complication ([place])    Hypertension     Afib followed by Dr. Adrian Prows Dr. Adrian Prows Dr. Novella Rob in [facility] April of last year, he underwent a right total knee replacement and has not felt well since BPH - flomax caused hypotension requiring midodrine Flomax and flecanide were discontinued on 05/2020 office visit Laveda Norman, MD [facility])  Cardiology appt on 11/22 - apparently back on flecanide 100 mg and anticoagulation with warfarin and SVT controlled with atenolol 12.5   acetaminophen (TYLENOL) 325 MG tablet, Take 325 mg by mouth every 6 (six) hours as needed. , Disp: , Rfl:   ALPRAZolam (XANAX) 0.5 MG tablet, Take 0.5 mg by mouth nightly as needed for Sleep., Disp: , Rfl:   ascorbic acid, vitamin C, (VITAMIN C) 500 MG tablet, Take 1 tablet (500 mg total) by mouth 2 times daily for 30 days. (Patient taking differently: Take 500 mg by mouth daily. ), Disp: , Rfl:   atenoloL (TENORMIN) 25 MG tablet, Take 12.5 mg by mouth daily., Disp: , Rfl:    atorvastatin (LIPITOR) 20 MG tablet, Take 1 tablet (20 mg total) by mouth daily., Disp: 90 tablet, Rfl: 3  flecainide (TAMBOCOR) 100 MG tablet, Take 1 tablet (100 mg total) by mouth 2 times daily., Disp: 180 tablet, Rfl: 1  glipiZIDE XL (GLUCOTROL XL) 10 MG 24 hr tablet, Take 1 tablet (10 mg total) by mouth daily., Disp: 90 tablet, Rfl: 3  lisinopriL (PRINIVIL,ZESTRIL) 5 MG tablet, 5 mg once daily depending on BP readings, Disp: 90 tablet, Rfl: 3  OMEGA-3/DHA/EPA/FISH OIL (OMEGA-3 FATTY ACIDS-FISH OIL) 300-1,000 mg capsule, Take 1 g by mouth 2 times daily. , Disp: , Rfl:   semaglutide (OZEMPIC) 0.25 mg or 0.5 mg(2 mg/1.5 mL) injection, Inject 0.4 mLs (0.5 mg total) into the skin once a week., Disp: 1.5 mL, Rfl: 5  vit C/E/Zn/coppr/lutein/zeaxan (PRESERVISION AREDS-2 ORAL), Take 1 tablet by mouth 2 times daily. , Disp: , Rfl:   warfarin (COUMADIN) 5 MG tablet *ANTICOAGULANT*, Take 1 and 1/2 tablets everyday except 1 tablet on Sundays, Tuesdays and Thursdays- as directed, Disp: 145 tablet, Rfl: 1  blood glucose control, high Solution, Use control solution to test meter, Disp: 1 each, Rfl: 1  blood glucose control, normal Solution, Use control solution to test meter., Disp: 1 each, Rfl: 0  blood-glucose meter Misc, Use meter kit to test blood sugars twice daily., Disp: 1 each, Rfl: 0  ONETOUCH VERIO TEST STRIPS Strp test strips, USE 1 STRIP TO CHECK GLUCOSE TWICE DAILY, Disp: 200 strip, Rfl: 1  No other recent changes in these medications.

## 2020-10-16 NOTE — ED Provider Notes (Signed)
[facility] EMERGENCY DEPARTMENT Provider Note  CSN: [id number] Arrival date & time: 10/15/20 2206  Chief Complaint(s) Twitchings/Tremors  HPI Bobby Petty is a 79 y.o. male with a past medical history listed below including atrial fibrillation on Coumadin who presents to the emergency department with 24 hours of full body jerking.  Patient reports that he has had a similar episode 20 years ago that were brief episodes lasting less than 30 minutes each every 6 months.  He reports that he has not had any of these episodes in the last 15 years.  He denies any focal deficits.  No headache.  No visual disturbance.  No associated chest pain or shortness of breath.  No nausea or vomiting.  No abdominal pain.  No recent fevers or infections.  Patient denies any alcohol or illicit drug use.  No chemical exposure.  No trauma.  Patient reports that he recently had an adjustment of his flecainide which was increased to 100 mg twice daily from 50 mg twice daily on November 17.  However he reports that this was lowered back in April/May during the hospitalization.  He reports that he has been on the 100 mg twice daily flecainide for approximately 5 years.  HPI  Past Medical History Past Medical History:  Diagnosis Date  . Atrial fibrillation (HCC)   . Diabetes mellitus without complication (HCC)   . Hypertension    There are no problems to display for this patient.  Home Medication(s) Prior to Admission medications   Medication Sig Start Date End Date Taking? Authorizing Provider  atorvastatin (LIPITOR) 20 MG tablet Take 20 mg by mouth daily.    [provider]  flecainide (TAMBOCOR) 50 MG tablet Take 50 mg by mouth 2 (two) times daily.    [provider]  glipiZIDE (GLUCOTROL) 10 MG tablet Take 10 mg by mouth daily before breakfast.    [provider]  insulin lispro protamine-lispro (HUMALOG 50/50 MIX) (50-50) 100 UNIT/ML SUSP injection Inject into  the skin 2 (two) times daily before a meal.    [provider]  metoprolol tartrate (LOPRESSOR) 25 MG tablet Take 25 mg by mouth 2 (two) times daily.    [provider]  midodrine (PROAMATINE) 5 MG tablet Take 5 mg by mouth 3 (three) times daily with meals.    [provider]  tamsulosin (FLOMAX) 0.4 MG CAPS capsule Take 0.4 mg by mouth.    [provider]  warfarin (COUMADIN) 7.5 MG tablet Take 7.5 mg by mouth daily.    [provider]                                                                                                                                    Past Surgical History Past Surgical History:  Procedure Laterality Date  . KNEE SURGERY     Family History No family history on file.  Social History Social  History   Tobacco Use  . Smoking status: Never Smoker  . Smokeless tobacco: Never Used  Substance Use Topics  . Alcohol use: Never  . Drug use: Never   Allergies Gabapentin  Review of Systems Review of Systems All other systems are reviewed and are negative for acute change except as noted in the HPI  Physical Exam Vital Signs  I have reviewed the triage vital signs BP 135/87   Pulse 80   Temp 97.7 F (36.5 C) (Oral)   Resp 19   Ht 6' (1.829 m)   Wt 85 kg   SpO2 95%   BMI 25.41 kg/m   Physical Exam Vitals reviewed.  Constitutional:      General: He is not in acute distress.    Appearance: He is well-developed. He is not diaphoretic.  HENT:     Head: Normocephalic and atraumatic.     Nose: Nose normal.  Eyes:     General: No scleral icterus.       Right eye: No discharge.        Left eye: No discharge.     Conjunctiva/sclera: Conjunctivae normal.     Pupils: Pupils are equal, round, and reactive to light.  Cardiovascular:     Rate and Rhythm: Normal rate and regular rhythm.     Heart sounds: No murmur heard.  No friction rub. No gallop.   Pulmonary:     Effort: Pulmonary effort is normal. No  respiratory distress.     Breath sounds: Normal breath sounds. No stridor. No rales.  Abdominal:     General: There is no distension.     Palpations: Abdomen is soft.     Tenderness: There is no abdominal tenderness.  Musculoskeletal:        General: No tenderness.     Cervical back: Normal range of motion and neck supple.  Skin:    General: Skin is warm and dry.     Findings: No erythema or rash.  Neurological:     Mental Status: He is alert and oriented to person, place, and time.     Comments: Mental Status:  Alert and oriented to person, place, and time.  Attention and concentration normal.  Speech clear.  Recent memory is intact  Cranial Nerves:  II Visual Fields: Intact to confrontation. Visual fields intact. III, IV, VI: Pupils equal and reactive to light and near. Full eye movement without nystagmus  V Facial Sensation: Normal. No weakness of masticatory muscles  VII: No facial weakness or asymmetry  VIII Auditory Acuity: Grossly normal  IX/X: The uvula is midline; the palate elevates symmetrically  XI: Normal sternocleidomastoid and trapezius strength  XII: The tongue is midline. No atrophy or fasciculations.   Motor System: Muscle Strength: 5/5 and symmetric in the upper and lower extremities. No pronation or drift.  Muscle Tone: Tone and muscle bulk are normal in the upper and lower extremities.  Having intermittent myoclonus of head, torso, and extremities. Reflexes: DTRs: 1+ and symmetrical in all four extremities. No Clonus Coordination: Intact finger-to-nose, heel-to-shin.   Sensation: Intact to light touch. Gait: deferred      ED Results and Treatments Labs (all labs ordered are listed, but only abnormal results are displayed) Labs Reviewed  COMPREHENSIVE METABOLIC PANEL - Abnormal; Notable for the following components:      Result Value   CO2 21 (*)    Glucose, Bld 143 (*)    Calcium 8.7 (*)    AST 47 (*)  ALT 52 (*)    All other components within  normal limits  PROTIME-INR - Abnormal; Notable for the following components:   Prothrombin Time 25.0 (*)    INR 2.4 (*)    All other components within normal limits  CBG MONITORING, ED - Abnormal; Notable for the following components:   Glucose-Capillary 148 (*)    All other components within normal limits  I-STAT CHEM 8, ED - Abnormal; Notable for the following components:   Glucose, Bld 140 (*)    Calcium, Ion 1.09 (*)    All other components within normal limits  RESP PANEL BY RT-PCR (FLU A&B, COVID) ARPGX2  CBC WITH DIFFERENTIAL/PLATELET  MAGNESIUM  AMMONIA  FLECAINIDE LEVEL                                                                                                                         EKG  EKG Interpretation  Date/Time:    Ventricular Rate:    PR Interval:    QRS Duration:   QT Interval:    QTC Calculation:   R Axis:     Text Interpretation:        Radiology CT Head Wo Contrast  Result Date: 10/16/2020 CLINICAL DATA:  Seizure EXAM: CT HEAD WITHOUT CONTRAST TECHNIQUE: Contiguous axial images were obtained from the base of the skull through the vertex without intravenous contrast. COMPARISON:  03/26/2020 FINDINGS: Brain: There is no mass, hemorrhage or extra-axial collection. There is generalized atrophy without lobar predilection. Hypodensity of the white matter is most commonly associated with chronic microvascular disease. Vascular: No abnormal hyperdensity of the major intracranial arteries or dural venous sinuses. No intracranial atherosclerosis. Skull: The visualized skull base, calvarium and extracranial soft tissues are normal. Sinuses/Orbits: No fluid levels or advanced mucosal thickening of the visualized paranasal sinuses. No mastoid or middle ear effusion. The orbits are normal. IMPRESSION: Generalized atrophy and chronic microvascular ischemia without acute intracranial abnormality. Electronically Signed   By: Deatra Robinson M.D.   On: 10/16/2020 05:52     Pertinent labs & imaging results that were available during my care of the patient were reviewed by me and considered in my medical decision making (see chart for details).  Medications Ordered in ED Medications  diazepam (VALIUM) injection 5 mg (has no administration in time range)  diazepam (VALIUM) injection 2.5 mg (2.5 mg Intravenous Given 10/16/20 0526)  Procedures Procedures  (including critical care time)  Medical Decision Making / ED Course I have reviewed the nursing notes for this encounter and the patient's prior records (if available in EHR or on provided paperwork).   Bobby Petty was evaluated in Emergency Department on 10/16/2020 for the symptoms described in the history of present illness. He was evaluated in the context of the global COVID-19 pandemic, which necessitated consideration that the patient might be at risk for infection with the SARS-CoV-2 virus that causes COVID-19. Institutional protocols and algorithms that pertain to the evaluation of patients at risk for COVID-19 are in a state of rapid change based on information released by regulatory bodies including the CDC and federal and state organizations. These policies and algorithms were followed during the patient's care in the ED.  Patient presents with myoclonus. No focal deficits on exam. Labs without significant electrolyte derangements.  He does have mild hypocalcemia but do not believe that these would be contributing to his symptoms. Other than flecainide patient has not taken any other medication that would cause myoclonus.  Symptoms significantly resolved with 2.5 mg of Valium. CT head negative. Symptoms returned approximately 30 minutes after Valium was given.  Case discussed with neurology, who does not believe these are consistent with seizures. She did recommend  obtaining an MRI, EEG, ammonia and flecainide levels.  Given the patient's difficulty ambulating and high risk of falls on blood thinners, will discuss admission with medicine.  If MRI reveals evidence of strokes or EEG reveals evidence of seizures, neurology can be formally consulted.      Final Clinical Impression(s) / ED Diagnoses Final diagnoses:  Myoclonus      This chart was dictated using voice recognition software.  Despite best efforts to proofread,  errors can occur which can change the documentation meaning.   Nira Conn, MD 10/16/20 [phone]

## 2020-10-16 NOTE — H&P (Addendum)
Date: 10/16/2020               [name] MRN: [id number]  DOB: [birth year] Age / Sex: 79 y.o., male   PCP: Redmond School, NP         Medical Service: Internal Medicine Teaching Service         Attending Physician: Dr. Reymundo Poll, MD    First Contact: Quincy Simmonds, MD Pager: [phone]  Second Contact: Drema Halon Pager: [facility] [phone])       After Hours (After 5p/  First Contact Pager: [phone]  weekends / holidays): Second Contact Pager: [phone]   Chief Complaint: Twitching  History of Present Illness:  [name] is a 79 year old male with a past medical history of paroxsymal atrial fibrillation on flecainide and warfarin, BPH, diabetes, hypertension, hyperlipidemia who presents with acute onset diffuse jerking that started early Saturday morning (12/04) that woke him up from sleep. No precipitating factors or preceding symptoms prior to when the jerking started. The jerking is constant got progressively worse throughout the day with difficulty walking or standing due to the jerking. He normally ambulates with a walker since his knee replacement in 02/2020. He has similar jerking episodes lasting several days at a time around 2008 and went to [facility] and neurologist (Dr. Quentin Mulling) without discovering clear etiology and was never placed on medications for this. The jerking resolved spontaneously without treatment and has not had recurrence in almost 6-7 years. He denies fever, chills, chest pain, palpitation, shortness of breath, dysuria, abdominal pain, constipation, LOC, loss of bowel or bladder function, confusion, weakness, dizziness, sensory change or headache. He denies any recent illness, new stressors, or injuries. Notes brother died several months ago, and he had a difficult hospitalization after knee surgery in April of this year.   Patient notes only recent medication is flecainide. He was hospitalized for knee replacement in April of 2021 and  subsequently developed urinary retention and placed on Flomax, but subsequently developed hypotension and dizziness requiring midodrine. At that time his flecainide dosage was decreased and was placed on metoprolol. At most recent visit with cardiology placed back on prior dose of flecainide of 100 mg twice daily and atenolol 12.5 daily. He take Alprazolam about twice a week for difficulty sleeping, but has not taken any in the past 3-4 days. He has also taken his wife's premipexole for restless legs, but states he generally does not have symptoms of restless less and has not taken any for months. He denies any supplement use.  In the ED patient was found to have myoclonus without focal neurological deficits. Labs without electrolyte derangement or leucocytosis. Treated with 2.5 mg valium prior to head CT with improvement of myoclonus but return and given 5 additional mgs of Valium without recurrence of myoclonus.   ED Course:   Lab Orders     Resp Panel by RT-PCR (Flu A&B, Covid) Nasopharyngeal Swab     CBC with Differential     Comprehensive metabolic panel     Magnesium     Protime-INR     Ammonia     Flecainide level     Comprehensive metabolic panel     Magnesium     Phosphorus     CBC WITH DIFFERENTIAL     Protime-INR     TSH     Hemoglobin A1c     Basic metabolic panel     CBC     Protime-INR  CBC     CBG monitoring, ED     I-stat chem 8, ED (not at [facility] or [facility])   Meds:  Current Meds  Medication Sig  . acetaminophen (TYLENOL) 325 MG tablet Take 325 mg by mouth every 6 (six) hours as needed for fever, headache or pain.  Marland Kitchen. ALPRAZolam (XANAX) 0.5 MG tablet Take 0.5 mg by mouth at bedtime as needed for sleep.  Marland Kitchen. ascorbic acid (VITAMIN C) 500 MG tablet Take 1 tablet by mouth in the morning and at bedtime.  Marland Kitchen. atenolol (TENORMIN) 25 MG tablet Take 0.5 tablets by mouth daily.  Marland Kitchen. atorvastatin (LIPITOR) 20 MG tablet Take 20 mg by mouth daily.  . flecainide (TAMBOCOR) 100 MG  tablet Take 100 mg by mouth 2 (two) times daily.   Marland Kitchen. glipiZIDE (GLUCOTROL) 10 MG tablet Take 10 mg by mouth daily before breakfast.  . lisinopril (ZESTRIL) 5 MG tablet Take 5 mg by mouth daily.  . Multiple Vitamins-Minerals (PRESERVISION AREDS 2 PO) Take 1 tablet by mouth in the morning and at bedtime.  Marland Kitchen. omega-3 acid ethyl esters (LOVAZA) 1 g capsule Take 1 g by mouth 2 (two) times daily.  . Semaglutide,0.25 or 0.5MG /DOS, (OZEMPIC, 0.25 OR 0.5 MG/DOSE,) 2 MG/1.5ML SOPN Inject 0.5 mg into the skin every Monday.  . warfarin (COUMADIN) 5 MG tablet Take 5 mg by mouth See admin instructions. Take 7.5 mg (1.5 tablets) on M/W/F/Sa at 6pm Take 5 mg (1 tablet) on Su/T/Th at 6pm    Social: Live in [place] with wife, retired used to run heavy equipment. Denies alcohol tobacco or illicit drug use. Former smoker with 50 pack year history  Family History: Brother with pancreatic cancer   Allergies: Allergies as of 10/15/2020 - Review Complete 10/15/2020  Allergen Reaction Noted  . Gabapentin  04/21/2020   Past Medical History:  Diagnosis Date  . Atrial fibrillation (HCC)   . Diabetes mellitus without complication (HCC)   . Hypertension      Review of Systems: A complete ROS was negative except as per HPI.   Physical Exam: Blood pressure 137/76, pulse 68, temperature 97.7 F (36.5 C), temperature source Oral, resp. rate 16, height 6' (1.829 m), weight 85 kg, SpO2 98 %. Physical Exam Constitutional:      General: He is not in acute distress.    Appearance: Normal appearance.  HENT:     Head: Normocephalic and atraumatic.     Right Ear: External ear normal.     Left Ear: External ear normal.     Nose: Nose normal.     Mouth/Throat:     Mouth: Mucous membranes are moist.     Pharynx: Oropharynx is clear.  Eyes:     Extraocular Movements: Extraocular movements intact.     Conjunctiva/sclera: Conjunctivae normal.     Pupils: Pupils are equal, round, and reactive to light.   Cardiovascular:     Rate and Rhythm: Normal rate and regular rhythm.     Pulses: Normal pulses.     Heart sounds: Normal heart sounds.  Pulmonary:     Effort: Pulmonary effort is normal. No respiratory distress.     Breath sounds: Normal breath sounds.  Abdominal:     General: Abdomen is flat. Bowel sounds are normal. There is no distension.     Palpations: Abdomen is soft.     Tenderness: There is no abdominal tenderness.  Musculoskeletal:        General: Normal range of motion.  Cervical back: Normal range of motion and neck supple.     Right lower leg: No edema.     Left lower leg: No edema.     Comments: surgical scar on right knee  Skin:    General: Skin is warm and dry.     Capillary Refill: Capillary refill takes less than 2 seconds.  Neurological:     General: No focal deficit present.     Mental Status: He is alert and oriented to person, place, and time. Mental status is at baseline.     Cranial Nerves: No cranial nerve deficit.     Sensory: No sensory deficit.     Motor: No weakness.     Coordination: Coordination normal.     Deep Tendon Reflexes: Reflexes normal.     Comments: No myoclonic movements since second dose of valium   Psychiatric:        Mood and Affect: Mood normal.        Thought Content: Thought content normal.    Labs: CBC    Component Value Date/Time   WBC 6.1 10/16/2020 1008   RBC 4.35 10/16/2020 1008   HGB 13.8 10/16/2020 1008   HCT 42.7 10/16/2020 1008   PLT 138 (L) 10/16/2020 1008   MCV 98.2 10/16/2020 1008   MCH 31.7 10/16/2020 1008   MCHC 32.3 10/16/2020 1008   RDW 13.6 10/16/2020 1008   LYMPHSABS 1.0 10/16/2020 1008   MONOABS 0.6 10/16/2020 1008   EOSABS 0.0 10/16/2020 1008   BASOSABS 0.0 10/16/2020 1008     CMP     Component Value Date/Time   NA 135 10/16/2020 1008   K 4.0 10/16/2020 1008   CL 102 10/16/2020 1008   CO2 21 (L) 10/16/2020 1008   GLUCOSE 122 (H) 10/16/2020 1008   BUN 19 10/16/2020 1008   CREATININE  0.87 10/16/2020 1008   CALCIUM 8.4 (L) 10/16/2020 1008   PROT 6.4 (L) 10/16/2020 1008   ALBUMIN 3.6 10/16/2020 1008   AST 35 10/16/2020 1008   ALT 48 (H) 10/16/2020 1008   ALKPHOS 53 10/16/2020 1008   BILITOT 0.9 10/16/2020 1008   GFRNONAA >60 10/16/2020 1008   GFRAA >60 04/21/2020 0950    Imaging: EEG  Result Date: 10/16/2020 Bobby Quest, MD     10/16/2020 10:06 AM Patient Name: Bobby Petty MRN: [id number] Epilepsy Attending: Charlsie Petty Referring Physician/Provider: Dr Bobby Petty Date: 10/16/2020 Duration: Patient history: 79yoM with presents to the emergency department with 24 hours of full body jerking. Level of alertness: Awake, drowsy, sleep, comatose, lethargic AEDs during EEG study: None Technical aspects: This EEG study was done with scalp electrodes positioned according to the 10-20 International system of electrode placement. Electrical activity was acquired at a sampling rate of 500Hz  and reviewed with a high frequency filter of 70Hz  and a low frequency filter of 1Hz . EEG data were recorded continuously and digitally stored. Description: The posterior dominant rhythm consists of 8 Hz activity of moderate voltage (25-35 uV) seen predominantly in posterior head regions, symmetric and reactive to eye opening and eye closing. Physiologic photic driving was not seen during photic stimulation.  Hyperventilation was not performed.   Multiple episodes of intermittent body twitching were noted. Concomitant eeg before, during and after the event didn't show any eeg change to suggest seizure. IMPRESSION: This study is within normal limits. No seizures or epileptiform discharges were seen throughout the recording. Multiple episodes of intermittent body twitching were noted without concomitant eeg change  and were NOT epileptic. Bobby Petty   CT Head Wo Contrast  Result Date: 10/16/2020 CLINICAL DATA:  Seizure EXAM: CT HEAD WITHOUT CONTRAST TECHNIQUE: Contiguous axial  images were obtained from the base of the skull through the vertex without intravenous contrast. COMPARISON:  03/26/2020 FINDINGS: Brain: There is no mass, hemorrhage or extra-axial collection. There is generalized atrophy without lobar predilection. Hypodensity of the white matter is most commonly associated with chronic microvascular disease. Vascular: No abnormal hyperdensity of the major intracranial arteries or dural venous sinuses. No intracranial atherosclerosis. Skull: The visualized skull base, calvarium and extracranial soft tissues are normal. Sinuses/Orbits: No fluid levels or advanced mucosal thickening of the visualized paranasal sinuses. No mastoid or middle ear effusion. The orbits are normal. IMPRESSION: Generalized atrophy and chronic microvascular ischemia without acute intracranial abnormality. Electronically Signed   By: Deatra Robinson M.D.   On: 10/16/2020 05:52   MR BRAIN WO CONTRAST  Result Date: 10/16/2020 CLINICAL DATA:  Myoclonus. EXAM: MRI HEAD WITHOUT CONTRAST TECHNIQUE: Multiplanar, multiecho pulse sequences of the brain and surrounding structures were obtained without intravenous contrast. COMPARISON:  CT head 10/16/2020 FINDINGS: Brain: Generalized atrophy.  Negative for hydrocephalus. Negative for acute infarct. Mild to moderate white matter changes with scattered small deep white matter hyperintensities bilaterally. Brainstem and cerebellum normal. Negative for hemorrhage or mass. Vascular: Normal arterial flow voids. Skull and upper cervical spine: No focal skeletal lesion. Sinuses/Orbits: Paranasal sinuses clear.  Negative orbit. Other: None IMPRESSION: No acute abnormality Atrophy and mild to moderate chronic microvascular ischemic change in the white matter. Electronically Signed   By: Marlan Palau M.D.   On: 10/16/2020 08:04      Assessment & Plan by Problem: Active Problems:   Myoclonus  Mr Namari Breton is a 79 year old male with a past medical history of  paroxsymal atrial fibrillation on flecainide and warfarin, BPH, diabetes, hypertension, hyperlipidemia who presents with acute onset diffuse jerking and admit for myoclonus .  Myclonus Patient present with recurrence of myoclonus for the past 2 days with no apparent inciting factors that improved with valium. Patient has not had these symptoms for the past 6-7 years, previously resolved without treatment. Differential is broad including infection, seizure, stroke , medication/drug induced, malignancy given he is a former smoking with extensive smoking history, neurodegenerative disease. Of note patient with recent increase of flecainide to  which may cause myoclonus. No signs of infection, no LOC or symptoms suggestive of seizure, mentation at baseline with no apparent cognitive deficits, no focal neurological findings on exam including myoclonus making this difficult to evaluate. Labs without electrolyte derangements, CT head negative, MRI and EEG pending. Will hold flecainide until level is back. - Hold flecainide - MRI, EEG to evaluate for stroke and seizure - seizure precautions  - TSH - PT to assess mobility  Paroxsymal atrial fibrillation on warfarin and flecainide. Denies history of valvular afib, continues on warfarin. INR of 2.4.  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 Feels able to empty bladder.  -monitor for retention  Diet: Heart Healthy Code: Full  Prior to Admission Living Arrangement: Home, living with wife Anticipated Discharge Location: Home Barriers to Discharge: Medical management   Dispo: Admit patient to Observation with expected length of stay less than 2 midnights.  Signed: Quincy Simmonds, MD  10/16/2020, 12:03 PM  Pager: [phone]

## 2020-10-16 NOTE — Progress Notes (Signed)
EEG complete - results pending 

## 2020-10-17 DIAGNOSIS — G253 Myoclonus: Secondary | ICD-10-CM | POA: Diagnosis not present

## 2020-10-17 DIAGNOSIS — I10 Essential (primary) hypertension: Secondary | ICD-10-CM | POA: Diagnosis not present

## 2020-10-17 DIAGNOSIS — E119 Type 2 diabetes mellitus without complications: Secondary | ICD-10-CM | POA: Diagnosis not present

## 2020-10-17 DIAGNOSIS — Z20822 Contact with and (suspected) exposure to covid-19: Secondary | ICD-10-CM | POA: Diagnosis not present

## 2020-10-17 LAB — BASIC METABOLIC PANEL
Anion gap: 8 (ref 5–15)
BUN: 18 mg/dL (ref 8–23)
CO2: 25 mmol/L (ref 22–32)
Calcium: 8.8 mg/dL — ABNORMAL LOW (ref 8.9–10.3)
Chloride: 103 mmol/L (ref 98–111)
Creatinine, Ser: 1.01 mg/dL (ref 0.61–1.24)
GFR, Estimated: >60 mL/min (ref >60)
Glucose, Bld: 132 mg/dL — ABNORMAL HIGH (ref 70–99)
Potassium: 3.8 mmol/L (ref 3.5–5.1)
Sodium: 136 mmol/L (ref 135–145)

## 2020-10-17 LAB — GLUCOSE, CAPILLARY
Glucose-Capillary: 100 mg/dL — ABNORMAL HIGH (ref 70–99)
Glucose-Capillary: 123 mg/dL — ABNORMAL HIGH (ref 70–99)
Glucose-Capillary: 137 mg/dL — ABNORMAL HIGH (ref 70–99)
Glucose-Capillary: 140 mg/dL — ABNORMAL HIGH (ref 70–99)
Glucose-Capillary: 153 mg/dL — ABNORMAL HIGH (ref 70–99)
Glucose-Capillary: 252 mg/dL — ABNORMAL HIGH (ref 70–99)

## 2020-10-17 LAB — CBC
HCT: 36.8 % — ABNORMAL LOW (ref 39.0–52.0)
Hemoglobin: 13.2 g/dL (ref 13.0–17.0)
MCH: 32.6 pg (ref 26.0–34.0)
MCHC: 35.9 g/dL (ref 30.0–36.0)
MCV: 90.9 fL (ref 80.0–100.0)
Platelets: 156 10*3/uL (ref 150–400)
RBC: 4.05 MIL/uL — ABNORMAL LOW (ref 4.22–5.81)
RDW: 13.5 % (ref 11.5–15.5)
WBC: 3.5 10*3/uL — ABNORMAL LOW (ref 4.0–10.5)
nRBC: 0 % (ref 0.0–0.2)

## 2020-10-17 LAB — PROTIME-INR
INR: 4.8 (ref 0.8–1.2)
Prothrombin Time: 43.7 seconds — ABNORMAL HIGH (ref 11.4–15.2)

## 2020-10-17 MED ORDER — FLECAINIDE ACETATE 50 MG PO TABS
50.0000 mg | ORAL_TABLET | Freq: Once | ORAL | Status: AC
  Administered 2020-10-17: 50 mg via ORAL
  Filled 2020-10-17 (×2): qty 1

## 2020-10-17 MED ORDER — LISINOPRIL 5 MG PO TABS
5.0000 mg | ORAL_TABLET | Freq: Every day | ORAL | Status: DC
  Administered 2020-10-17: 5 mg via ORAL
  Filled 2020-10-17 (×2): qty 1

## 2020-10-17 MED ORDER — FLECAINIDE ACETATE 100 MG PO TABS: 50.0000 mg | ORAL_TABLET | Freq: Two times a day (BID) | ORAL | 0 refills | Status: AC

## 2020-10-17 NOTE — Plan of Care (Signed)
  Problem: Health Behavior/Discharge Planning: Goal: Ability to manage health-related needs will improve Outcome: Progressing   Problem: Clinical Measurements: Goal: Will remain free from infection Outcome: Progressing Goal: Respiratory complications will improve Outcome: Progressing   Problem: Activity: Goal: Risk for activity intolerance will decrease Outcome: Progressing   Problem: Nutrition: Goal: Adequate nutrition will be maintained Outcome: Progressing   Problem: Coping: Goal: Level of anxiety will decrease Outcome: Progressing   Problem: Pain Managment: Goal: General experience of comfort will improve Outcome: Progressing

## 2020-10-17 NOTE — Progress Notes (Signed)
ANTICOAGULATION CONSULT NOTE   Pharmacy Consult for Warfarin  Indication: atrial fibrillation  Allergies  Allergen Reactions  . Gabapentin     Patient Measurements: Height: 6' (182.9 cm) Weight: 85 kg (187 lb 6.3 oz) IBW/kg (Calculated) : 77.6 Heparin Dosing Weight:   Vital Signs: Temp: 98 F (36.7 C) (12/06 0013) Temp Source: Oral (12/06 0013) BP: 147/83 (12/06 0013) Pulse Rate: 69 (12/06 0013)  Labs: Recent Labs    10/15/20 2231 10/15/20 2231 10/16/20 0416 10/16/20 0525 10/16/20 0525 10/16/20 1008 10/17/20 0249  HGB 14.7   < >  --  15.0   < > 13.8 13.2  HCT 44.9   < >  --  44.0  --  42.7 36.8*  PLT 150  --   --   --   --  138* 156  LABPROT  --   --  25.0*  --   --  25.3* 43.7*  INR  --   --  2.4*  --   --  2.4* 4.8*  CREATININE 0.83   < >  --  0.90  --  0.87 1.01   < > = values in this interval not displayed.    Estimated Creatinine Clearance: 65.1 mL/min (by C-G formula based on SCr of 1.01 mg/dL).  Assessment: Patient is a 17 yom that is being admitted for twitching. At home the patient takes warfarin for Afib.  Marland Kitchen PTA regimen: Warfarin 7.5mg  MWFSa and 5 mg TTSun  Goal of Therapy:  INR 2-3 Monitor platelets by anticoagulation protocol: Yes   Plan:  Hold warfarin today Daily INR  Elmer Sow, PharmD, BCPS, BCCCP Clinical Pharmacist [phone]  Please check AMION for all [facility] Pharmacy numbers  10/17/2020 8:48 AM

## 2020-10-17 NOTE — Progress Notes (Signed)
PT Cancellation Note  Patient Details Name: Bobby Petty MRN: [id number] DOB: [birth year]   Cancelled Treatment:    Reason Eval/Treat Not Completed: Medical issues which prohibited therapy (pt currently with dx of myoclonus with INR 4.8 making pt very high risk for fall and hemarthrosis and not currently appropriate for mobility)   Shaneya Taketa B Harden Bramer 10/17/2020, 6:56 AM  Merryl Hacker, PT Acute Rehabilitation Services Pager: [phone] Office: [phone]

## 2020-10-17 NOTE — Progress Notes (Signed)
HD#0 Subjective:  Overnight Events: none   No further jerking, feeling well this morning able to   Objective:  Vital signs in last 24 hours: Vitals:   10/16/20 1134 10/16/20 1430 10/16/20 1805 10/17/20 0013  BP: 137/76 129/73 (!) 160/70 (!) 147/83  Pulse: 68 71 65 69  Resp: 16 20 18 19   Temp:   98 F (36.7 C) 98 F (36.7 C)  TempSrc:   Oral Oral  SpO2: 98% 98% 97% 92%  Weight:   85 kg   Height:   6' (1.829 m)    Supplemental O2: Room Air SpO2: 92 %   Physical Exam:  Physical Exam Constitutional:      Appearance: Normal appearance.  HENT:     Head: Normocephalic and atraumatic.     Nose: Nose normal.     Mouth/Throat:     Mouth: Mucous membranes are moist.     Pharynx: Oropharynx is clear.  Cardiovascular:     Rate and Rhythm: Normal rate and regular rhythm.     Pulses: Normal pulses.     Heart sounds: Normal heart sounds.  Pulmonary:     Effort: Pulmonary effort is normal.     Breath sounds: Normal breath sounds.  Abdominal:     General: Abdomen is flat. Bowel sounds are normal.     Palpations: Abdomen is soft.  Musculoskeletal:     Right lower leg: No edema.     Left lower leg: No edema.  Skin:    General: Skin is warm and dry.     Capillary Refill: Capillary refill takes less than 2 seconds.  Neurological:     General: No focal deficit present.     Mental Status: He is alert and oriented to person, place, and time. Mental status is at baseline.     Filed Weights   10/15/20 2225 10/16/20 1805  Weight: 85 kg 85 kg     Intake/Output Summary (Last 24 hours) at 10/17/2020 0610 Last data filed at 10/17/2020 0600 Gross per 24 hour  Intake 320 ml  Output 1200 ml  Net -880 ml   Net IO Since Admission: -880 mL [10/17/20 0610]  Pertinent Labs: CBC Latest Ref Rng & Units 10/17/2020 10/16/2020 10/16/2020  WBC 4.0 - 10.5 K/uL 3.5(L) 6.1 -  Hemoglobin 13.0 - 17.0 g/dL 14/03/2020 00.7 12.1  Hematocrit 39 - 52 % 36.8(L) 42.7 44.0  Platelets 150 - 400 K/uL 156  138(L) -    CMP Latest Ref Rng & Units 10/17/2020 10/16/2020 10/16/2020  Glucose 70 - 99 mg/dL 14/03/2020) 883(G) 549(I)  BUN 8 - 23 mg/dL 18 19 21   Creatinine 0.61 - 1.24 mg/dL 264(B 5.83  Sodium 135 - 145 mmol/L 136 135 139  Potassium 3.5 - 5.1 mmol/L 3.8 4.0 4.3  Chloride 98 - 111 mmol/L 103 102 104  CO2 22 - 32 mmol/L 25 21(L) -  Calcium 8.9 - 10.3 mg/dL 0.94) 0.76) -  Total Protein 6.5 - 8.1 g/dL - 6.4(L) -  Total Bilirubin 0.3 - 1.2 mg/dL - 0.9 -  Alkaline Phos 38 - 126 U/L - 53 -  AST 15 - 41 U/L - 35 -  ALT 0 - 44 U/L - 48(H) -   Imaging: EEG  Result Date: 10/16/2020 8.1(J, MD     10/16/2020 10:06 AM Patient Name: Bobby Petty MRN: 14/03/2020 Epilepsy Attending: Casandra Doffing Referring Physician/Provider: Dr [id number] [place] Date: 10/16/2020 Duration: Patient history: 79yoM with presents to the  emergency department with 24 hours of full body jerking. Level of alertness: Awake, drowsy, sleep, comatose, lethargic AEDs during EEG study: None Technical aspects: This EEG study was done with scalp electrodes positioned according to the 10-20 International system of electrode placement. Electrical activity was acquired at a sampling rate of 500Hz  and reviewed with a high frequency filter of 70Hz  and a low frequency filter of 1Hz . EEG data were recorded continuously and digitally stored. Description: The posterior dominant rhythm consists of 8 Hz activity of moderate voltage (25-35 uV) seen predominantly in posterior head regions, symmetric and reactive to eye opening and eye closing. Physiologic photic driving was not seen during photic stimulation.  Hyperventilation was not performed.   Multiple episodes of intermittent body twitching were noted. Concomitant eeg before, during and after the event didn't show any eeg change to suggest seizure. IMPRESSION: This study is within normal limits. No seizures or epileptiform discharges were seen throughout the recording. Multiple  episodes of intermittent body twitching were noted without concomitant eeg change and were NOT epileptic.   MR BRAIN WO CONTRAST  Result Date: 10/16/2020 CLINICAL DATA:  Myoclonus. EXAM: MRI HEAD WITHOUT CONTRAST TECHNIQUE: Multiplanar, multiecho pulse sequences of the brain and surrounding structures were obtained without intravenous contrast. COMPARISON:  CT head 10/16/2020 FINDINGS: Brain: Generalized atrophy.  Negative for hydrocephalus. Negative for acute infarct. Mild to moderate white matter changes with scattered small deep white matter hyperintensities bilaterally. Brainstem and cerebellum normal. Negative for hemorrhage or mass. Vascular: Normal arterial flow voids. Skull and upper cervical spine: No focal skeletal lesion. Sinuses/Orbits: Paranasal sinuses clear.  Negative orbit. Other: None IMPRESSION: No acute abnormality Atrophy and mild to moderate chronic microvascular ischemic change in the white matter. Electronically Signed   By: Charlsie Quest M.D.   On: 10/16/2020 08:04    Assessment/Plan:   Active Problems:   Myoclonus  Patient Summary: [name] is a 79 year old male with a past medical history of paroxsymal atrial fibrillation on flecainide and warfarin, BPH, diabetes, hypertension, hyperlipidemia who presents with acute onset diffuse jerking and admit for myoclonus  Myoclonus No further myoclonus. MRI without acute abnormality and EEG without seizure, episodes of body twitching during study without concomitant EEG changes TSH wnl. Myoclonus may be due to recent increase in flecainide dosage. - plan to discharge on flecainide 50 mg BID with close follow up with his cardiologist  Paroxsymal atrial fibrillation on warfarin and flecainide. Denies history of valvular afib, continues on warfarin. INR of 4.8.  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 A1c of 5.7 -SSI  Hypertension, hyperlipidemia - Continue atorvastatin  - Restart lisinopril 5 mg daily  Diet: Heart Healthy Code: Full  Prior to Admission Living Arrangement: Home, living with wife Anticipated Discharge Location: Home Barriers to Discharge: None  Dispo: Anticipated date of discharge today  14/03/2020, MD 10/17/2020, 6:10 AM Pager: [phone]  Please contact the on call pager after 5 pm and on weekends at [phone].

## 2020-10-17 NOTE — Care Management Obs Status (Signed)
MEDICARE OBSERVATION STATUS NOTIFICATION   Patient Details  Name: Bobby Petty MRN: [id number] Date of Birth: 20-Sep-1941   Medicare Observation Status Notification Given:  Yes    Bess Kinds, RN 10/17/2020, 3:12 PM

## 2020-10-17 NOTE — TOC Transition Note (Signed)
Transition of Care [facility]) - CM/SW Discharge Note   Patient Details  Name: Bobby Petty MRN: [id number] Date of Birth: 18-Jul-1941  [facility]) CM/SW Contact:  Bess Kinds, RN Phone Number: [phone] 10/17/2020, 3:16 PM   Clinical Narrative:     Spoke with patient and spouse at the bedside. Patient is transitioning home today. Spouse to provide transportation home. Recently discharged from [facility]. Stated he has been up in the room without difficulty. No TOC needs identified.   Final next level of care: Home/Self Care Barriers to Discharge: No Barriers Identified   Patient Goals and CMS Choice Patient states their goals for this hospitalization and ongoing recovery are:: home with spouse CMS Medicare.gov Compare Post Acute Care list provided to:: Patient Choice offered to / list presented to : NA  Discharge Placement                       Discharge Plan and Services                DME Arranged: N/A DME Agency: NA       HH Arranged: NA HH Agency: NA        Social Determinants of Health (SDOH) Interventions     Readmission Risk Interventions No flowsheet data found.

## 2020-10-17 NOTE — Progress Notes (Signed)
OT Cancellation Note  Patient Details Name: Delmar Arriaga MRN: [id number] DOB: 1941-04-28   Cancelled Treatment:    Reason Eval/Treat Not Completed: Patient not medically ready--pt currently with dx of myoclonus with INR 4.8 making pt very high risk for fall and hemarthrosis and not currently appropriate OT, HR at rest also 128.  Will follow and see as able/appropriate.   Barry Brunner, OT Acute Rehabilitation Services Pager [phone] Office [phone]   Chancy Milroy 10/17/2020, 10:19 AM

## 2020-10-17 NOTE — Progress Notes (Addendum)
Called due to elevated HR in 120s. Patient with HR in 130s this morning around 0900 improved with atenolol to 80s, but now increased to 120s. Patient is asymptomatic in room. He denies chest pain, shortness of breath, or palpitations. HR stable 120s with EKG with rate 95 showing junctional rhythm. Heart sounds without murmurs, chest is clear to ascultation. We have been holding his flecainide due to concern for causing myoclonus with plan to start him back on reduced dose of 50 mg twice daily on discharge today.   Plan: Elevated HR and junction Rhythmin likely due to holding patient's antiarrhythmic. Will order evening dose of flecainide. If HR improves he is ok for discharge, otherwise will keep him overnight.

## 2020-10-18 DIAGNOSIS — G253 Myoclonus: Secondary | ICD-10-CM

## 2020-10-18 DIAGNOSIS — I480 Paroxysmal atrial fibrillation: Secondary | ICD-10-CM

## 2020-10-18 LAB — GLUCOSE, CAPILLARY
Glucose-Capillary: 143 mg/dL — ABNORMAL HIGH (ref 70–99)
Glucose-Capillary: 141 mg/dL — ABNORMAL HIGH (ref 70–99)
Glucose-Capillary: 112 mg/dL — ABNORMAL HIGH (ref 70–99)

## 2020-10-18 LAB — PROTIME-INR
INR: 2.4 — ABNORMAL HIGH (ref 0.8–1.2)
Prothrombin Time: 24.9 seconds — ABNORMAL HIGH (ref 11.4–15.2)

## 2020-10-18 LAB — CBC
HCT: 38.8 % — ABNORMAL LOW (ref 39.0–52.0)
Hemoglobin: 13.2 g/dL (ref 13.0–17.0)
MCH: 32.4 pg (ref 26.0–34.0)
MCHC: 34 g/dL (ref 30.0–36.0)
MCV: 95.1 fL (ref 80.0–100.0)
Platelets: 146 10*3/uL — ABNORMAL LOW (ref 150–400)
RBC: 4.08 MIL/uL — ABNORMAL LOW (ref 4.22–5.81)
RDW: 13.5 % (ref 11.5–15.5)
WBC: 7.2 10*3/uL (ref 4.0–10.5)
nRBC: 0 % (ref 0.0–0.2)

## 2020-10-18 MED ORDER — FLECAINIDE ACETATE 50 MG PO TABS
50.0000 mg | ORAL_TABLET | Freq: Once | ORAL | Status: AC
  Administered 2020-10-18: 50 mg via ORAL
  Filled 2020-10-18: qty 1

## 2020-10-18 MED ORDER — WARFARIN SODIUM 5 MG PO TABS: 5.0000 mg | ORAL_TABLET | Freq: Once | ORAL | Status: DC

## 2020-10-18 MED ORDER — FLECAINIDE ACETATE 50 MG PO TABS
50.0000 mg | ORAL_TABLET | Freq: Once | ORAL | Status: DC
  Filled 2020-10-18: qty 1

## 2020-10-18 NOTE — Progress Notes (Signed)
OT Cancellation Note  Patient Details Name: Bobby Petty MRN: [id number] DOB: [birth year]   Cancelled Treatment:    Reason Eval/Treat Not Completed: OT screened, no needs identified, will sign off. Per PT Pt is ambulating around room mod I, toileting himself, wife typically performs IADL.   Evern Bio [name] 10/18/2020, 8:59 AM   [name] OTR/L Acute Rehabilitation Services Pager: [phone] Office: [phone]

## 2020-10-18 NOTE — Plan of Care (Signed)

## 2020-10-18 NOTE — Evaluation (Signed)
Physical Therapy Evaluation Patient Details Name: Bobby Petty MRN: [id number] DOB: 09-28-1941 Today's Date: 10/18/2020   History of Present Illness  79 yo admitted with myoclonus with normal EEG. PMhx: Afib, DM, HTN  Clinical Impression  Pt demonstrated no episodes of myoclonus during PT today. Pt needed 1 person hand held min assist for stability during ambulation and reported no sx of tachy despite raised HR. States that he normally hangs around the 80's with HR at home. Pt would benefit from skilled therapy in order to work on endurance with gait while keeping HR in a safe range. Pt would benefit from home health in order to increase his cardiovascular endurance with functional mobility so that he can reintegrate into the community HR during ambulation: 135bpm (no sx reported) HR sitting after ambulation: 126 bpm (no sx reported)    Follow Up Recommendations Home health PT    Equipment Recommendations       Recommendations for Other Services       Precautions / Restrictions        Mobility  Bed Mobility Overal bed mobility: Modified Independent             General bed mobility comments: able to go supine to sit independently with increased time    Transfers Overall transfer level: Needs assistance Equipment used: 1 person hand held assist Transfers: Sit to/from Stand Sit to Stand: Min guard         General transfer comment: min guard for sit to stand  Ambulation/Gait Ambulation/Gait assistance: Min assist Gait Distance (Feet): 170 Feet Assistive device: 1 person hand held assist Gait Pattern/deviations: Decreased stride length   Gait velocity interpretation: <1.8 ft/sec, indicate of risk for recurrent falls    Stairs            Wheelchair Mobility    Modified Rankin (Stroke Patients Only)       Balance Overall balance assessment: Needs assistance Sitting-balance support: Feet supported Sitting balance-Leahy Scale: Good       Standing  balance-Leahy Scale: Poor Standing balance comment: pt requires hand held assist to maintain standing balance statically and dynamically                             Pertinent Vitals/Pain Pain Assessment: No/denies pain    Home Living Family/patient expects to be discharged to:: Private residence Living Arrangements: Spouse/significant other Available Help at Discharge: Available 24 hours/Petty Type of Home: House Home Access: Ramped entrance     Home Layout: Laundry or work area in basement;One level (14 stairs) Home Equipment: Cane - single point;Walker - 2 wheels;Toilet riser      Prior Function Level of Independence: Needs assistance         Comments: 3 falls in the house couple months ago, doesn't drive or cook and clean; only goes out into the community for doctors appointments     Hand Dominance        Extremity/Trunk Assessment   Upper Extremity Assessment Upper Extremity Assessment: Defer to OT evaluation    Lower Extremity Assessment Lower Extremity Assessment: Overall WFL for tasks assessed    Cervical / Trunk Assessment Cervical / Trunk Assessment: Normal  Communication   Communication: No difficulties  Cognition Arousal/Alertness: Awake/alert Behavior During Therapy: WFL for tasks assessed/performed Overall Cognitive Status: Within Functional Limits for tasks assessed  General Comments      Exercises     Assessment/Plan    PT Assessment Patient needs continued PT services  PT Problem List Decreased strength;Cardiopulmonary status limiting activity       PT Treatment Interventions Gait training;Functional mobility training;Patient/family education;Therapeutic activities;Therapeutic exercise    PT Goals (Current goals can be found in the Care Plan section)  Acute Rehab PT Goals Patient Stated Goal: go home PT Goal Formulation: With patient Time For Goal Achievement:  11/01/20 Potential to Achieve Goals: Good    Frequency Min 3X/week   Barriers to discharge        Co-evaluation               AM-PAC PT "6 Clicks" Mobility  Outcome Measure Help needed turning from your back to your side while in a flat bed without using bedrails?: None Help needed moving from lying on your back to sitting on the side of a flat bed without using bedrails?: None Help needed moving to and from a bed to a chair (including a wheelchair)?: A Little Help needed standing up from a chair using your arms (e.g., wheelchair or bedside chair)?: A Little Help needed to walk in hospital room?: A Little Help needed climbing 3-5 steps with a railing? : A Little 6 Click Score: 20    End of Session Equipment Utilized During Treatment: Gait belt Activity Tolerance: Patient tolerated treatment well Patient left: in bed;with bed alarm set   PT Visit Diagnosis: Unsteadiness on feet (R26.81);Difficulty in walking, not elsewhere classified (R26.2)    Time: 7588-3254 PT Time Calculation (min) (ACUTE ONLY): 22 min   Charges:   PT Evaluation $PT Eval Moderate Complexity: 1 [address], SPT [id number]  Bobby Petty 10/18/2020, 9:27 AM

## 2020-10-18 NOTE — Progress Notes (Signed)
HD#0 Subjective:  Overnight Events: Patient became tachycardic to 120s in the evening. Given a dose flecainide with improvement of HR to 60-70s. Patient states he did not receive some of his medications yesterday and does not remember getting the flecainide yesterday. Patient was told to start taking half of his Flecainide twice a day until they see his cardiologist. Patient and wife were informed about hospital follow up appointment on 10/27/2020. Team informed them that patient would not have to follow up with neurology unless he continues to have more symptoms.    Objective:  Vital signs in last 24 hours: Vitals:   10/17/20 0943 10/17/20 1211 10/17/20 1918 10/17/20 2017  BP: 101/73 104/72  97/67  Pulse: (!) 128 81  100  Resp: 16 16 19 20   Temp: 98.6 F (37 C) 98.6 F (37 C)  97.7 F (36.5 C)  TempSrc:    Oral  SpO2: 97% 97%  97%  Weight:      Height:       Supplemental O2: Room Air SpO2: 97 %   Physical Exam:  Physical Exam Constitutional:      Appearance: Normal appearance.  HENT:     Head: Normocephalic and atraumatic.     Nose: Nose normal.     Mouth/Throat:     Mouth: Mucous membranes are moist.     Pharynx: Oropharynx is clear.  Cardiovascular:     Rate and Rhythm: Normal rate and regular rhythm.     Pulses: Normal pulses.     Heart sounds: Normal heart sounds.  Pulmonary:     Effort: Pulmonary effort is normal.     Breath sounds: Normal breath sounds.  Abdominal:     General: Abdomen is flat. Bowel sounds are normal.     Palpations: Abdomen is soft.  Musculoskeletal:     Right lower leg: No edema.     Left lower leg: No edema.  Skin:    General: Skin is warm and dry.     Capillary Refill: Capillary refill takes less than 2 seconds.  Neurological:     General: No focal deficit present.     Mental Status: He is alert and oriented to person, place, and time. Mental status is at baseline.     Filed Weights   10/15/20 2225 10/16/20 1805  Weight: 85  kg 85 kg     Intake/Output Summary (Last 24 hours) at 10/18/2020 0539 Last data filed at 10/17/2020 0600 Gross per 24 hour  Intake -  Output 600 ml  Net -600 ml   Net IO Since Admission: -880 mL [10/18/20 0539]  Pertinent Labs: CBC Latest Ref Rng & Units 10/18/2020 10/17/2020 10/16/2020  WBC 4.0 - 10.5 K/uL 7.2 3.5(L) 6.1  Hemoglobin 13.0 - 17.0 g/dL 14/03/2020 27.7 82.4  Hematocrit 39 - 52 % 38.8(L) 36.8(L) 42.7  Platelets 150 - 400 K/uL 146(L) 156 138(L)    CMP Latest Ref Rng & Units 10/17/2020 10/16/2020 10/16/2020  Glucose 70 - 99 mg/dL 14/03/2020) 361(W) 431(V)  BUN 8 - 23 mg/dL 18 19 21   Creatinine 0.61 - 1.24 mg/dL 400(Q 6.76  Sodium 135 - 145 mmol/L 136 135 139  Potassium 3.5 - 5.1 mmol/L 3.8 4.0 4.3  Chloride 98 - 111 mmol/L 103 102 104  CO2 22 - 32 mmol/L 25 21(L) -  Calcium 8.9 - 10.3 mg/dL 1.95) 0.93) -  Total Protein 6.5 - 8.1 g/dL - 6.4(L) -  Total Bilirubin 0.3 - 1.2 mg/dL - 0.9 -  Alkaline Phos 38 - 126 U/L - 53 -  AST 15 - 41 U/L - 35 -  ALT 0 - 44 U/L - 48(H) -   Imaging: No results found.  Assessment/Plan:   Active Problems:   Myoclonus  Patient Summary: Bobby Petty is a 79 year old male with a past medical history of paroxsymal atrial fibrillation on flecainide and warfarin, BPH, diabetes, hypertension, hyperlipidemia who presents with acute onset diffuse jerking and admit for myoclonus  Myoclonus No further myoclonus. MRI without acute abnormality and EEG without seizure, episodes of body twitching during study without concomitant EEG changes TSH wnl. Myoclonus may be due to recent increase in flecainide dosage. Needed to restart flecainide due to elevated HR  - Restarted on flecainide half prior dosage 50 mg twice daily  Paroxsymal atrial fibrillation on warfarin and flecainide. INR at 2.4 today. 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 A1c of 5.7 -SSI  Hypertension, hyperlipidemia - Continue atorvastatin  - Restart lisinopril 5 mg daily  Diet: Heart Healthy Code: Full  Prior to Admission Living Arrangement: Home, living with wife Anticipated Discharge Location: Home Barriers to Discharge: None  Dispo: Anticipated date of discharge today  Quincy Simmonds, MD 10/18/2020, 5:39 AM Pager: [phone]  Please contact the on call pager after 5 pm and on weekends at [phone].

## 2020-10-18 NOTE — Progress Notes (Signed)
ANTICOAGULATION CONSULT NOTE   Pharmacy Consult for Warfarin  Indication: atrial fibrillation  Allergies  Allergen Reactions  . Gabapentin     Patient Measurements: Height: 6' (182.9 cm) Weight: 85 kg (187 lb 6.3 oz) IBW/kg (Calculated) : 77.6 Heparin Dosing Weight:   Vital Signs: Temp: 97.8 F (36.6 C) (12/07 0621) Temp Source: Oral (12/07 0621) BP: 100/70 (12/07 0621) Pulse Rate: 89 (12/07 0621)  Labs: Recent Labs    10/16/20 0416 10/16/20 0525 10/16/20 1008 10/16/20 1008 10/17/20 0249 10/18/20 0145  HGB   < > 15.0 13.8   < > 13.2 13.2  HCT   < > 44.0 42.7  --  36.8* 38.8*  PLT  --   --  138*  --  156 146*  LABPROT   < >  --  25.3*  --  43.7* 24.9*  INR   < >  --  2.4*  --  4.8* 2.4*  CREATININE  --  0.90 0.87  --  1.01  --    < > = values in this interval not displayed.    Estimated Creatinine Clearance: 65.1 mL/min (by C-G formula based on SCr of 1.01 mg/dL).  Assessment: Patient is a 20 yom that is being admitted for twitching. At home the patient takes warfarin for Afib.  Marland Kitchen PTA regimen: Warfarin 7.5mg  MWFSa and 5 mg TTSun  INR therapeutic this AM, s/p dose held yesterday  Goal of Therapy:  INR 2-3 Monitor platelets by anticoagulation protocol: Yes   Plan:  Give warfarin 5mg  PO x 1 today Daily INR, s/s bleeding  , PharmD Clinical Pharmacist Please check AMION for all [facility] Pharmacy numbers 10/18/2020 7:55 AM

## 2020-10-18 NOTE — Discharge Summary (Signed)
Name: Bobby Petty MRN: [id number] DOB: [birth year] 79 y.o. PCP: Redmond School, NP  Date of Admission: 10/15/2020 10:06 PM Date of Discharge:  Attending Physician: Gust Rung, DO  Discharge Diagnosis: 1. Myoclonus  2. Paroxsymal atrial fibrillation   Discharge Medications: Allergies as of 10/18/2020      Reactions   Gabapentin       Medication List    TAKE these medications   acetaminophen 325 MG tablet Commonly known as: TYLENOL Take 325 mg by mouth every 6 (six) hours as needed for fever, headache or pain.   ALPRAZolam 0.5 MG tablet Commonly known as: XANAX Take 0.5 mg by mouth at bedtime as needed for sleep.   ascorbic acid 500 MG tablet Commonly known as: VITAMIN C Take 1 tablet by mouth in the morning and at bedtime.   atenolol 25 MG tablet Commonly known as: TENORMIN Take 0.5 tablets by mouth daily.   atorvastatin 20 MG tablet Commonly known as: LIPITOR Take 20 mg by mouth daily.   flecainide 100 MG tablet Commonly known as: TAMBOCOR Take 0.5 tablets (50 mg total) by mouth 2 (two) times daily. What changed: how much to take   glipiZIDE 10 MG tablet Commonly known as: GLUCOTROL Take 10 mg by mouth daily before breakfast.   lisinopril 5 MG tablet Commonly known as: ZESTRIL Take 5 mg by mouth daily.   omega-3 acid ethyl esters 1 g capsule Commonly known as: LOVAZA Take 1 g by mouth 2 (two) times daily.   Ozempic (0.25 or 0.5 MG/DOSE) 2 MG/1.5ML Sopn Generic drug: Semaglutide(0.25 or 0.5MG /DOS) Inject 0.5 mg into the skin every Monday.   PRESERVISION AREDS 2 PO Take 1 tablet by mouth in the morning and at bedtime.   warfarin 5 MG tablet Commonly known as: COUMADIN Take 5 mg by mouth See admin instructions. Take 7.5 mg (1.5 tablets) on M/W/F/Sa at 6pm Take 5 mg (1 tablet) on Su/T/Th at 6pm       Disposition and follow-up:   [name] was discharged from [facility] in Stable condition.  At the hospital follow  up visit please address:  1.  Myoclonus - No infectious, metabolic, neurological cause found, suspect due to recent increase dosage of flecainide. Evaluate for recurrence of symptoms on lower dosage of antiarrhythmic, consider switching antiarrythmic and follow up with neurology if myoclonus reoccurs.  Atrial fibrillation: Patient with junctional rhythm with HR in 120s off flecainide improved once back on reduced dose of 50 mg twice daily. Evaluate for recurrent atrial fibrillation and tritrate antiarrythmics. Consider switching medications to avoid recurrent myoclonus.  Also dicussed possibility of replacing warfarin with NOAC given elevated INR during admission, which patient is interested in if there are no specific indications requiring warfarin.  2.  Labs / imaging needed at time of follow-up: INR, CBC, BMP  3.  Pending labs/ test needing follow-up: Flecainide level  Follow-up Appointments:  Follow-up Information    Elissa Hefty, NP Follow up on 10/27/2020.   Why: Appointment at 2:00 pm Contact information: [address] [place] [address] [phone]               Hospital Course by problem list: 1. Myclonus Patient present with recurrence of myoclonus for the past 2 days with no apparent inciting factors that improved with valium. Patient has not had these symptoms for the past 6-7 years, previously resolved without treatment. No signs of infection and labs are unremarkable with no metabolic changes  explaining his symptoms.  CT head, MRI, and EEG without stroke, or seizure activity. Cannot rule out malignancy given he is a former smoking with extensive smoking history although CXR was normal. Myoclonus likely related to recent increase in flecainide to 100 mg BID 2 weeks ago. Myoclonus resolved after administration of diazepam without reoccurrence during this admission. Pt developed elevated HR with junctional rhythm that improved once placed back on half dosage of  flecainide. Will need close follow up with his cardiologist to titrate dose of antiarrhythmics. May need further neurological workup if myoclonus returns.  Paroxsymal atrial fibrillation on warfarin and flecainide Patient with atrial fibrillation on warfarin, flecainide and atenolol. Flecainide recently increased 2 weeks ago in outpatient setting and patient subsequently developed myoclonus. Flecainide initially held during this admission as myoclonus likely due to medication side effect of flecainide. However patient developed tachycardia to 120s with junctional rhythm this improved after restarting on half his normal dose of flecainide. Patient also with elevation of INR to 4.8  During admission, this improved to 2.4 after hold one dose of his warfarin. Patient does not believe he has history of valvular atrial fibrillation, and was interested in alternative oral anticoagulation regimen such as apixaban if he does not have any indications for warfarin therapy. Was unable reach his cardiologist for recommendations regarding his warfain or antiarrythmic therapy. Will need close follow up with Cardiology to adjust his medications to control atrial fibrillation without causing further myoclonus.   Discharge Vitals:   BP 97/61 (BP Location: Left Arm)   Pulse 66   Temp 98 F (36.7 C)   Resp 20   Ht 6' (1.829 m)   Wt 85 kg   SpO2 98%   BMI 25.41 kg/m   Pertinent Labs, Studies, and Procedures:  EEG  Result Date: 10/16/2020 Charlsie Quest, MD     10/16/2020 10:06 AM Patient Name: Bobby Petty MRN: [id number] Epilepsy Attending: Charlsie Quest Referring Physician/Provider: Dr Amadeo Garnet Cardama Date: 10/16/2020 Duration: Patient history: 79yoM with presents to the emergency department with 24 hours of full body jerking. Level of alertness: Awake, drowsy, sleep, comatose, lethargic AEDs during EEG study: None Technical aspects: This EEG study was done with scalp electrodes positioned according to  the 10-20 International system of electrode placement. Electrical activity was acquired at a sampling rate of 500Hz  and reviewed with a high frequency filter of 70Hz  and a low frequency filter of 1Hz . EEG data were recorded continuously and digitally stored. Description: The posterior dominant rhythm consists of 8 Hz activity of moderate voltage (25-35 uV) seen predominantly in posterior head regions, symmetric and reactive to eye opening and eye closing. Physiologic photic driving was not seen during photic stimulation.  Hyperventilation was not performed.   Multiple episodes of intermittent body twitching were noted. Concomitant eeg before, during and after the event didn't show any eeg change to suggest seizure. IMPRESSION: This study is within normal limits. No seizures or epileptiform discharges were seen throughout the recording. Multiple episodes of intermittent body twitching were noted without concomitant eeg change and were NOT epileptic.   CT Head Wo Contrast  Result Date: 10/16/2020 CLINICAL DATA:  Seizure EXAM: CT HEAD WITHOUT CONTRAST TECHNIQUE: Contiguous axial images were obtained from the base of the skull through the vertex without intravenous contrast. COMPARISON:  03/26/2020 FINDINGS: Brain: There is no mass, hemorrhage or extra-axial collection. There is generalized atrophy without lobar predilection. Hypodensity of the white matter is most commonly associated with chronic microvascular  disease. Vascular: No abnormal hyperdensity of the major intracranial arteries or dural venous sinuses. No intracranial atherosclerosis. Skull: The visualized skull base, calvarium and extracranial soft tissues are normal. Sinuses/Orbits: No fluid levels or advanced mucosal thickening of the visualized paranasal sinuses. No mastoid or middle ear effusion. The orbits are normal. IMPRESSION: Generalized atrophy and chronic microvascular ischemia without acute intracranial abnormality.  Electronically Signed   By: Deatra Robinson M.D.   On: 10/16/2020 05:52   MR BRAIN WO CONTRAST  Result Date: 10/16/2020 CLINICAL DATA:  Myoclonus. EXAM: MRI HEAD WITHOUT CONTRAST TECHNIQUE: Multiplanar, multiecho pulse sequences of the brain and surrounding structures were obtained without intravenous contrast. COMPARISON:  CT head 10/16/2020 FINDINGS: Brain: Generalized atrophy.  Negative for hydrocephalus. Negative for acute infarct. Mild to moderate white matter changes with scattered small deep white matter hyperintensities bilaterally. Brainstem and cerebellum normal. Negative for hemorrhage or mass. Vascular: Normal arterial flow voids. Skull and upper cervical spine: No focal skeletal lesion. Sinuses/Orbits: Paranasal sinuses clear.  Negative orbit. Other: None IMPRESSION: No acute abnormality Atrophy and mild to moderate chronic microvascular ischemic change in the white matter. Electronically Signed   By: Marlan Palau M.D.   On: 10/16/2020 08:04   CBC    Component Value Date/Time   WBC 7.2 10/18/2020 0145   RBC 4.08 (L) 10/18/2020 0145   HGB 13.2 10/18/2020 0145   HCT 38.8 (L) 10/18/2020 0145   PLT 146 (L) 10/18/2020 0145   MCV 95.1 10/18/2020 0145   MCH 32.4 10/18/2020 0145   MCHC 34.0 10/18/2020 0145   RDW 13.5 10/18/2020 0145   LYMPHSABS 1.0 10/16/2020 1008   MONOABS 0.6 10/16/2020 1008   EOSABS 0.0 10/16/2020 1008   BASOSABS 0.0 10/16/2020 1008   CMP Latest Ref Rng & Units 10/17/2020 10/16/2020 10/16/2020  Glucose 70 - 99 mg/dL 194(R) 740(C) 144(Y)  BUN 8 - 23 mg/dL 18 19 21   Creatinine 0.61 - 1.24 mg/dL 1.85 6.31  Sodium 135 - 145 mmol/L 136 135 139  Potassium 3.5 - 5.1 mmol/L 3.8 4.0 4.3  Chloride 98 - 111 mmol/L 103 102 104  CO2 22 - 32 mmol/L 25 21(L) -  Calcium 8.9 - 10.3 mg/dL 4.97) 0.2(O) -  Total Protein 6.5 - 8.1 g/dL - 6.4(L) -  Total Bilirubin 0.3 - 1.2 mg/dL - 0.9 -  Alkaline Phos 38 - 126 U/L - 53 -  AST 15 - 41 U/L - 35 -  ALT 0 - 44 U/L - 48(H) -      Discharge Instructions: Discharge Instructions    Diet - low sodium heart healthy   Complete by: As directed    Discharge instructions   Complete by: As directed    You were hospitalized for Tremors. Thank you for allowing 3.7(C to be part of your care.    Please follow up with the following providers: 1. Dr. [place] (Cardiology) - discuss Warfarin alternatives and Flecainide dosage you have an appointment scheduled with his PA on 12/16 at 2 pm 2. Primary Care Physician   Please note these changes made to your medications:   - Medications to continue: ALPRAZolam (XANAX) 0.5 MG tablet, Take 0.5 mg by mouth at bedtime as needed for sleep. ascorbic acid (VITAMIN C) 500 MG tablet, Take 1 tablet by mouth in the morning and at bedtime. atenolol (TENORMIN) 25 MG tablet, Take 0.5 tablets by mouth daily. atorvastatin (LIPITOR) 20 MG tablet, Take 20 mg by mouth daily. glipiZIDE (GLUCOTROL) 10 MG tablet, Take 10 mg  by mouth daily before breakfast. lisinopril (ZESTRIL) 5 MG tablet, Take 5 mg by mouth daily. Multiple Vitamins-Minerals (PRESERVISION AREDS 2 PO), Take 1 tablet by mouth in the morning and at bedtime. omega-3 acid ethyl esters (LOVAZA) 1 g capsule, Take 1 g by mouth 2 (two) times daily. Semaglutide,0.25 or 0.5MG /DOS, (OZEMPIC, 0.25 OR 0.5 MG/DOSE,) 2 MG/1.5ML SOPN, Inject 0.5 mg into the skin every Monday. warfarin (COUMADIN) 5 MG tablet, Take 5 mg by mouth See admin instructions. Take 7.5 mg (1.5 tablets) on M/W/F/Sa at 6pm Take 5 mg (1 tablet) on Su/T/Th at 6pm  - Medications to start: flecainide (TAMBOCOR) 100 MG tablet, Take 1/2 tablet (50 mg) by mouth 2 (two) times daily.   - Medications to discontinue:  flecainide (TAMBOCOR) 100 MG tablet, Take 100 mg by mouth 2 (two) times daily.   Please make sure to follow up with your Cardiologist and decrease your dose of Flecainide  Please call our clinic if you have any questions or concerns, we may be able to help and keep  you from a long and expensive emergency room wait. Our clinic and after hours phone number is [phone], the best time to call is Monday through Friday 9 am to 4 pm but there is always someone available 24/7 if you have an emergency. If you need medication refills please notify your pharmacy one week in advance and they will send us a request.   Increase activity slowly   Complete by: As directed    Increase activity slowly   Complete by: As directed       Signed: Quincy SimmondsLiang, Amoura Ransier, MD 10/18/2020, 1:38 PM   Pager: [phone]

## 2020-10-19 LAB — FLECAINIDE LEVEL: Flecainide: 0.44 ug/mL (ref 0.20–1.00)

## 2021-10-20 IMAGING — DX DG CHEST 2V
2 series · 2 of 2 positions shown · non-contrast
Comparison: Single-view of the chest 03/16/2020. PA and lateral
chest 02/24/2020.

CLINICAL DATA: Shortness of breath.

EXAM:
CHEST - 2 VIEW

[chest lat]
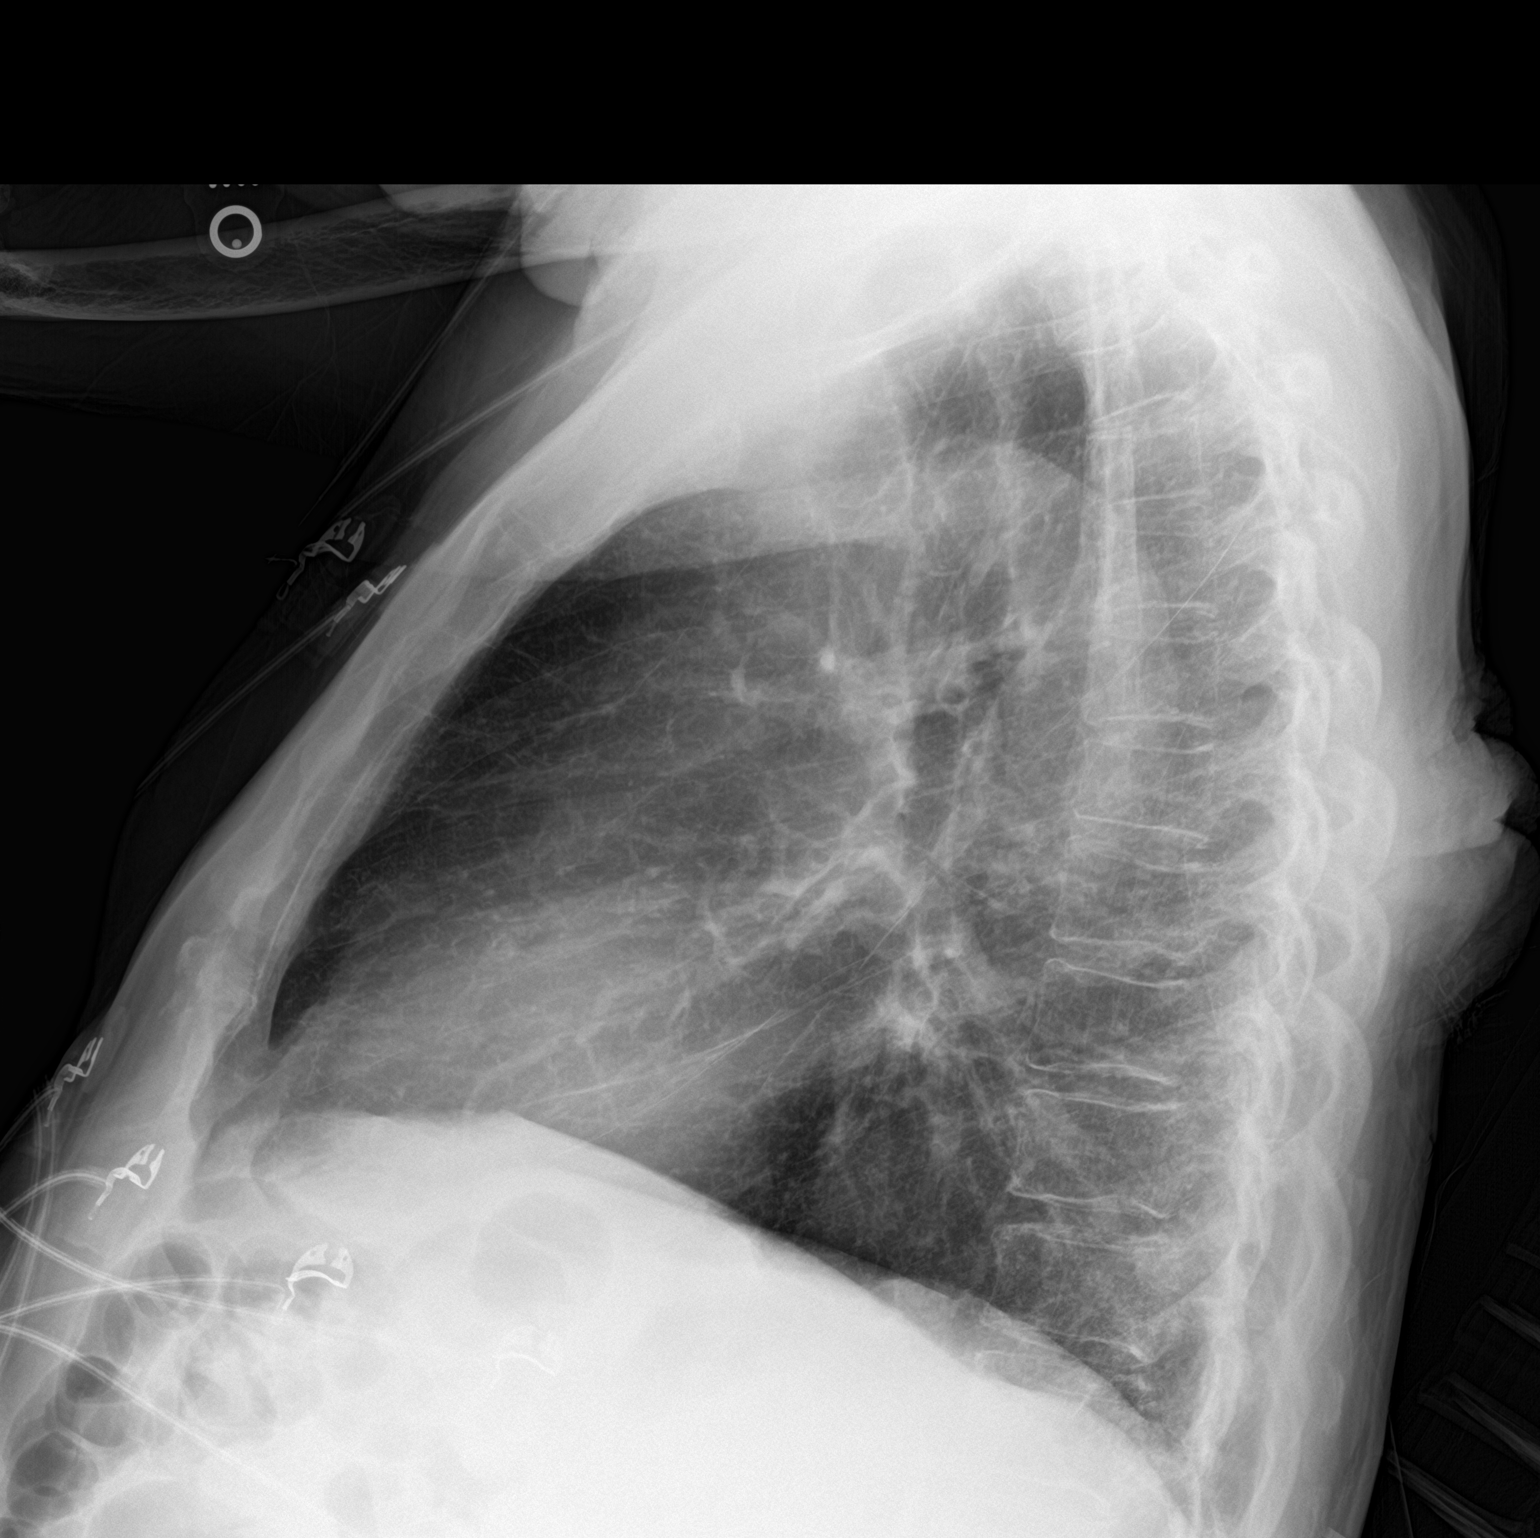

[chest ap strecther]
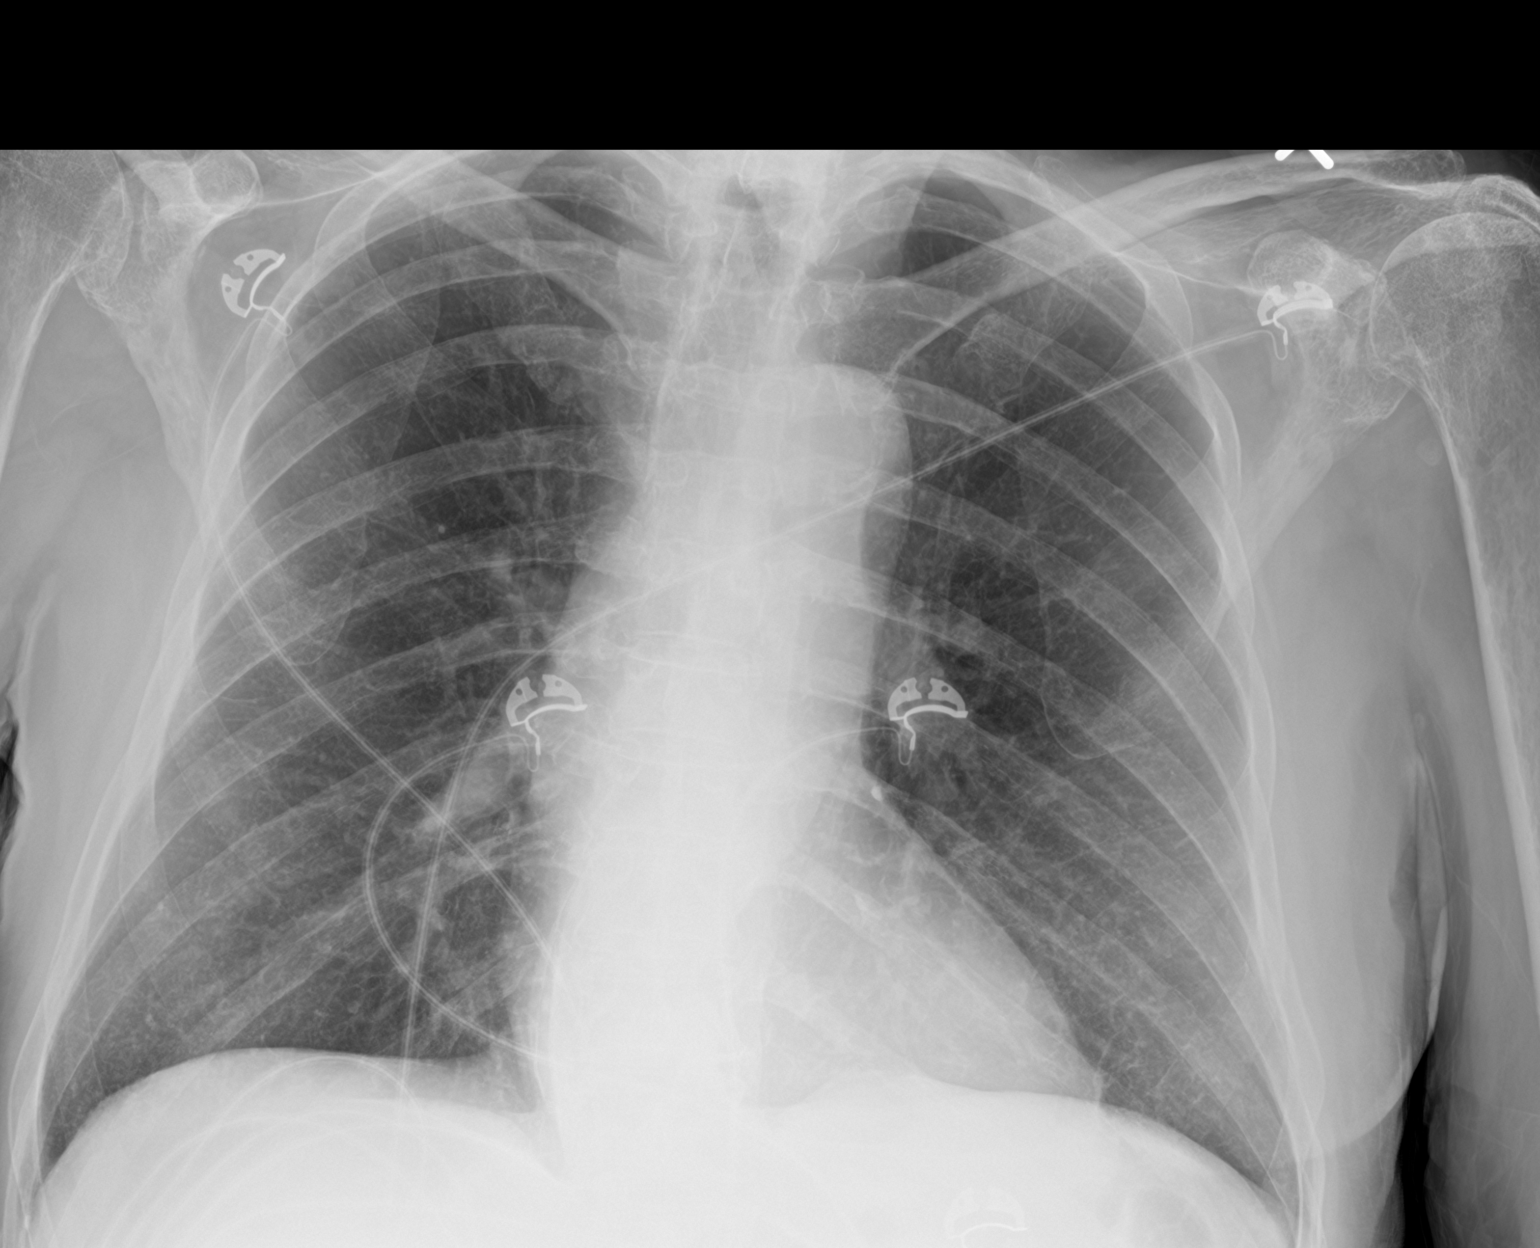

[2 of 2 positions shown; findings below may reference images not displayed]

FINDINGS: Lungs clear. Heart size normal. Atherosclerosis noted. No
pneumothorax or pleural fluid. No acute or focal bony abnormality.
IMPRESSION: No acute disease.

Aortic Atherosclerosis (1T1T5-BOL.L).

## 2022-04-16 IMAGING — CT CT HEAD W/O CM
4 of 5 series · 15 of 47 positions shown, 17 images · non-contrast
Comparison: 03/26/2020

CLINICAL DATA: Seizure

EXAM:
CT HEAD WITHOUT CONTRAST
TECHNIQUE: Contiguous axial images were obtained from the base of the skull
through the vertex without intravenous contrast.

[Series 3: head wo · axial · 0.45mm/px · z∈[-124,-18]mm · 4 of 37 slices shown (1 of 2)]
[im 8/37  brain]
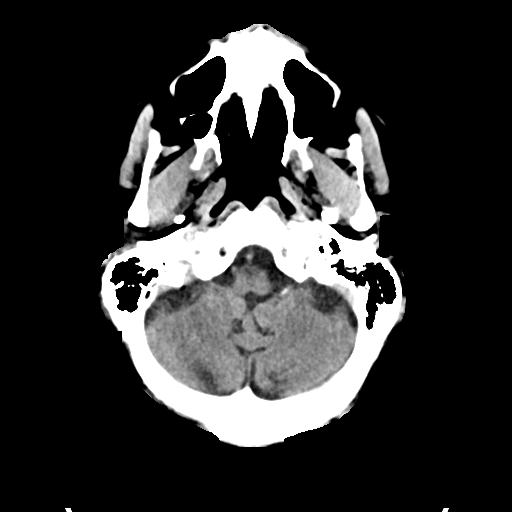
[im 15/37  brain]
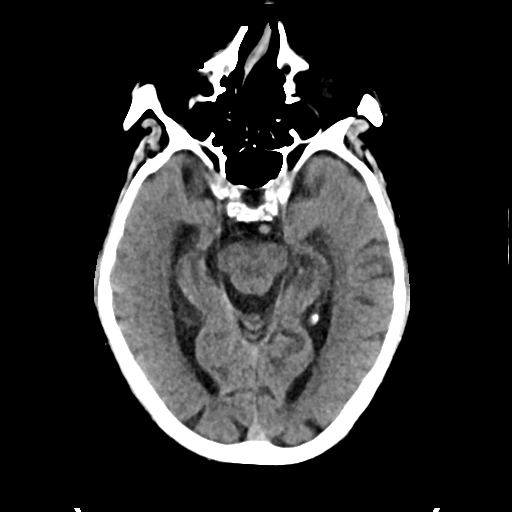
[im 22/37  brain]
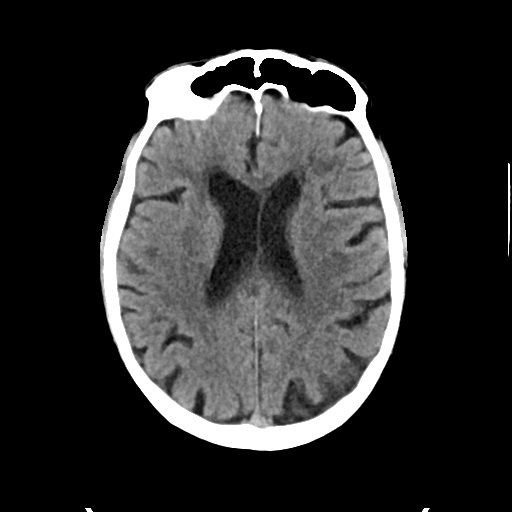
[im 29/37  brain]
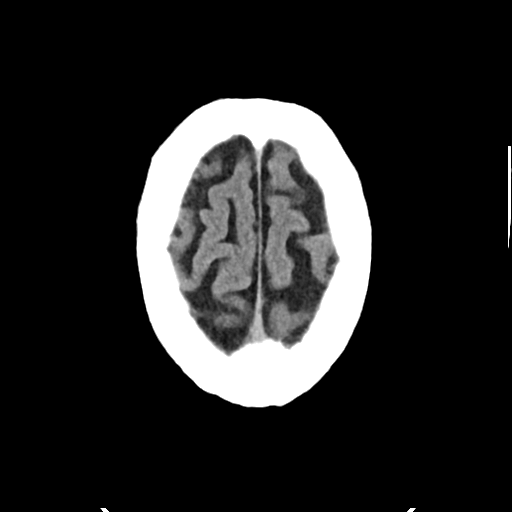

[Series 5: cor soft · coronal · 0.36mm/px · 3 of 75 slices shown]
[im 25/75  brain]
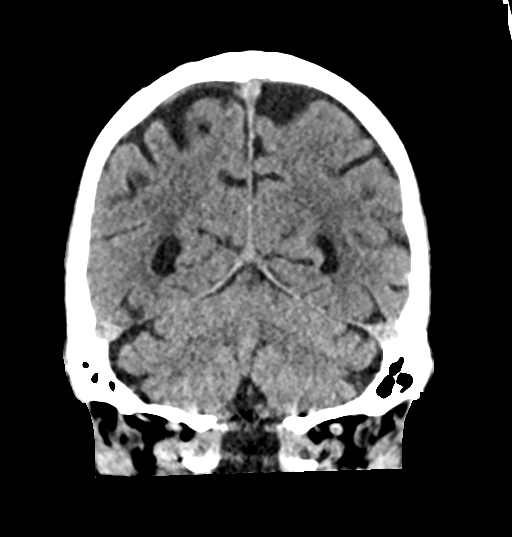
[im 33/75  brain]
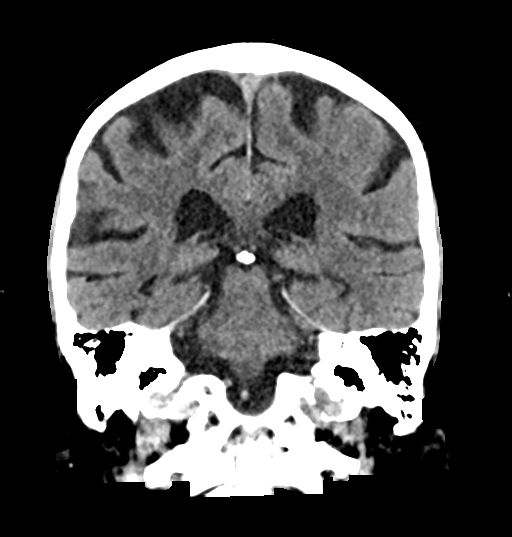
[im 42/75  brain]
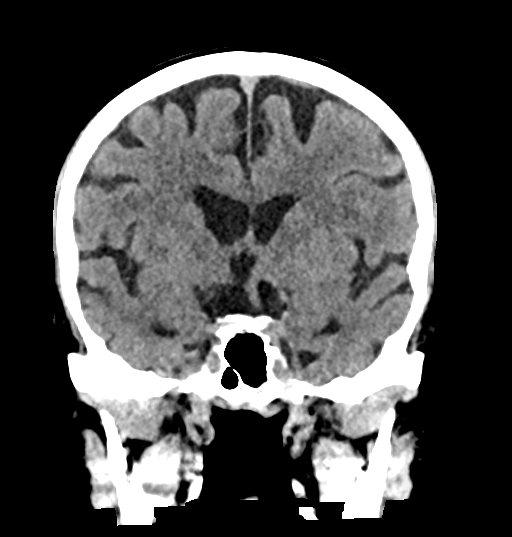

[Series 6: sag soft · sagittal · 0.42mm/px · 3 of 55 slices shown]
[im 19/55  brain]
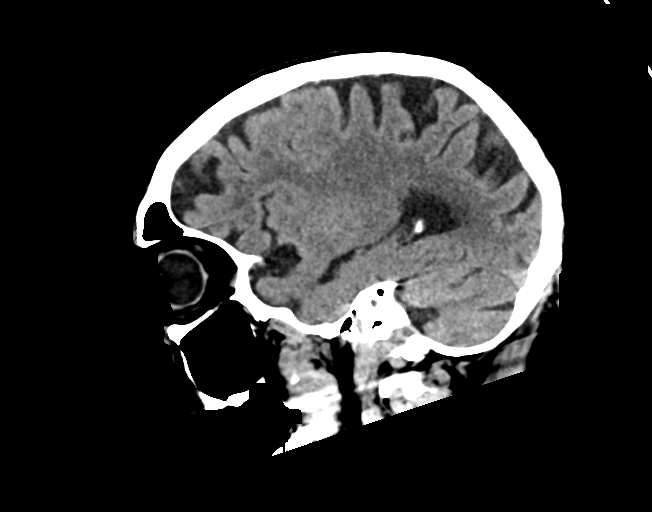
[im 28/55  brain]
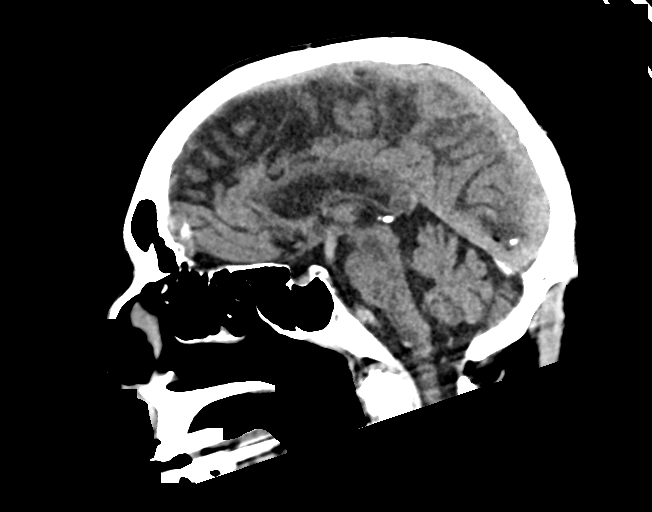
[im 37/55  brain]
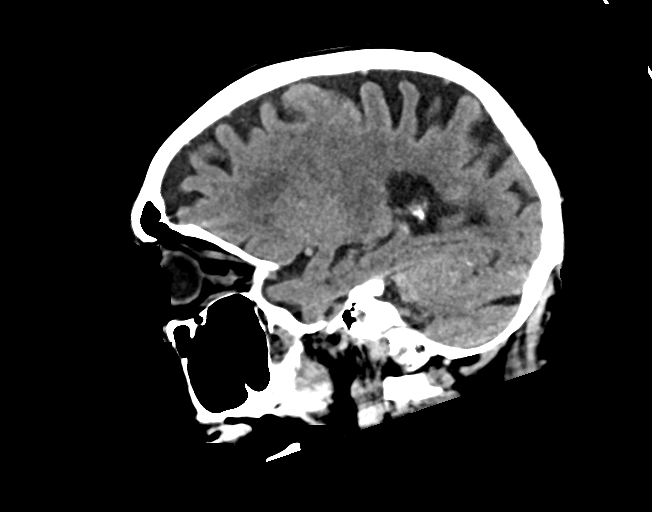

[Series 7: head wo · axial · 0.36mm/px · z∈[-77,+31]mm · 5 of 36 slices shown, 7 images (2 of 2)]
[im 6/36  brain]
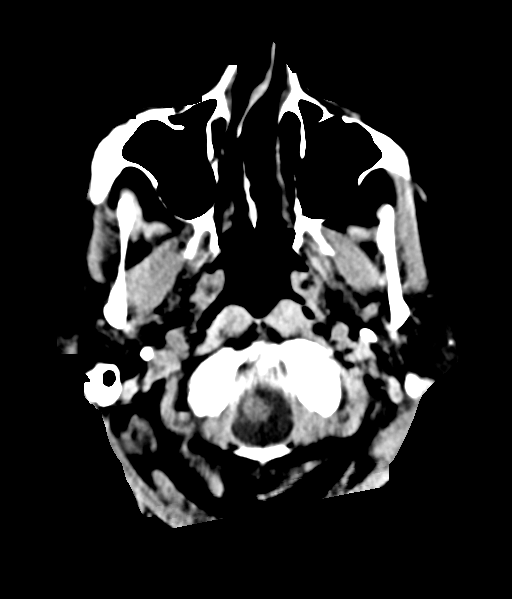
[im 6/36  bone]
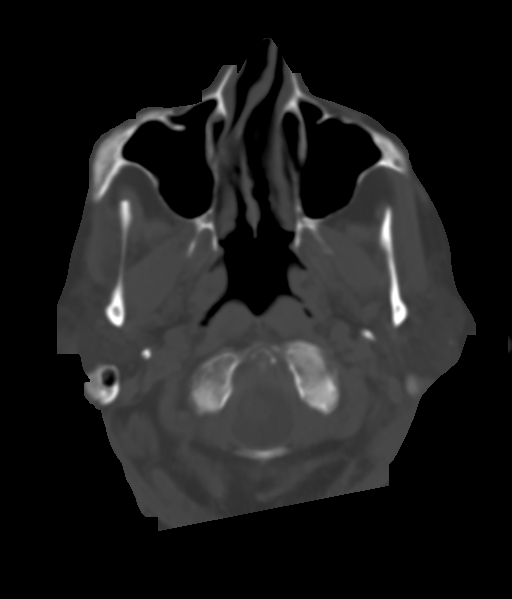
[im 12/36  brain]
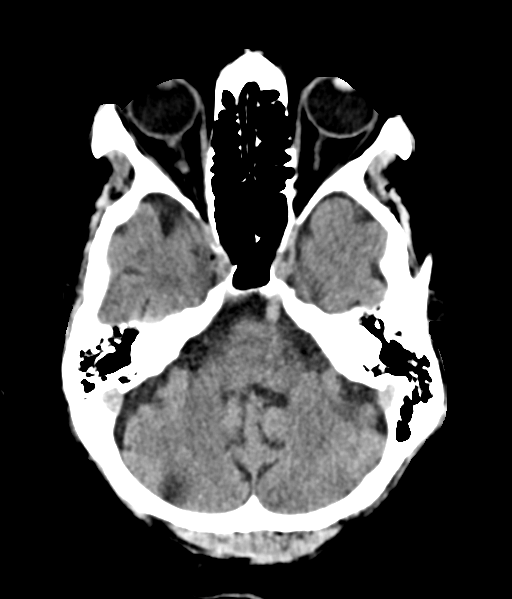
[im 18/36  brain]
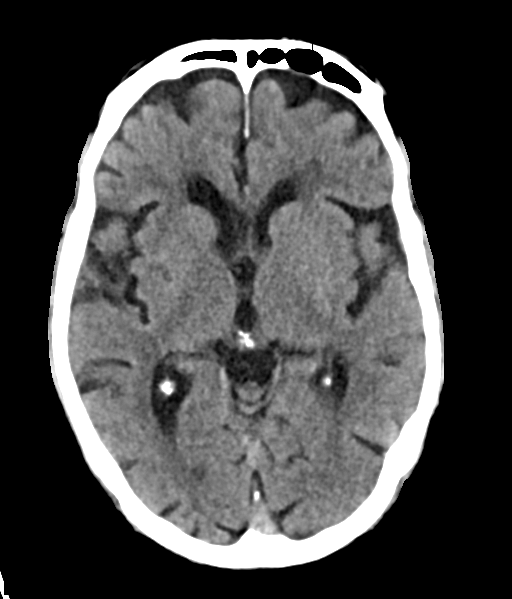
[im 24/36  brain]
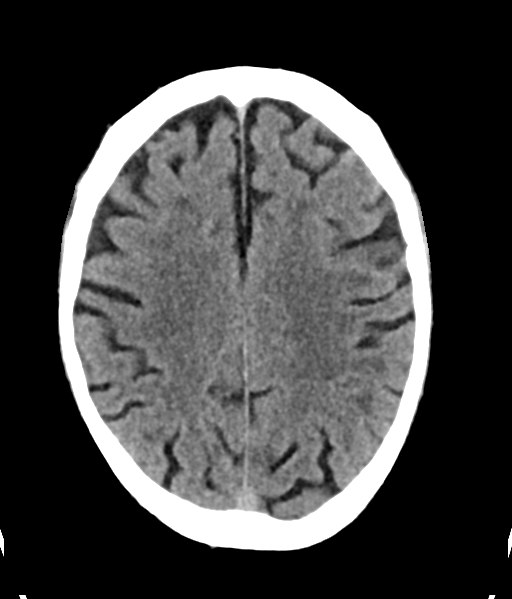
[im 30/36  brain]
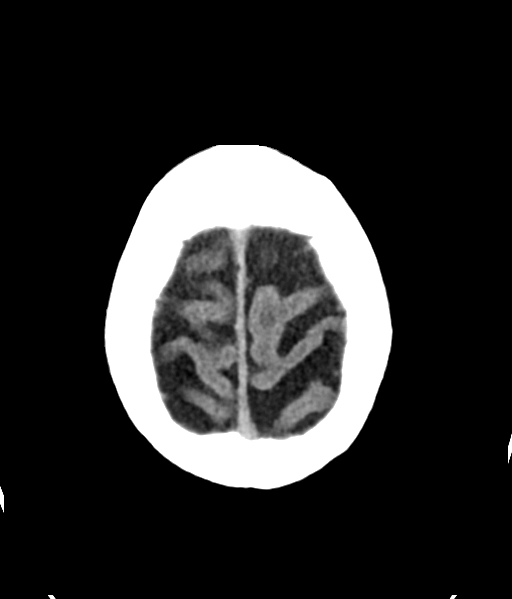
[im 30/36  bone]
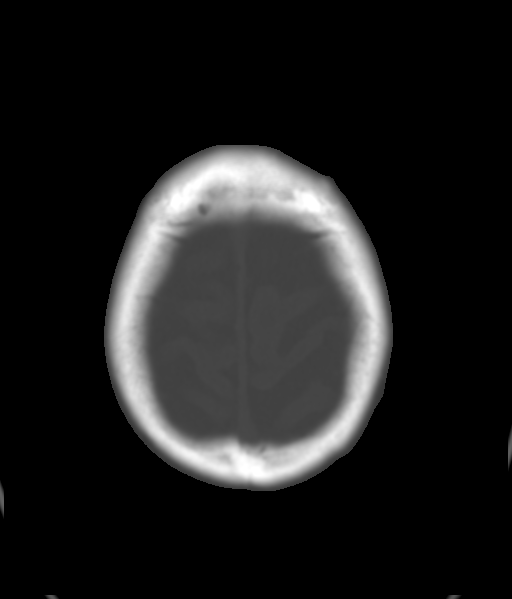

[15 of 47 positions shown; findings below may reference images not displayed]

FINDINGS: Brain: There is no mass, hemorrhage or extra-axial collection. There
is generalized atrophy without lobar predilection. Hypodensity of
the white matter is most commonly associated with chronic
microvascular disease.

Vascular: No abnormal hyperdensity of the major intracranial
arteries or dural venous sinuses. No intracranial atherosclerosis.

Skull: The visualized skull base, calvarium and extracranial soft
tissues are normal.

Sinuses/Orbits: No fluid levels or advanced mucosal thickening of
the visualized paranasal sinuses. No mastoid or middle ear effusion.
The orbits are normal.
IMPRESSION: Generalized atrophy and chronic microvascular ischemia without acute
intracranial abnormality.
